# Patient Record
Sex: Male | Born: 1965 | Race: White | Hispanic: No | Marital: Single | Smoking: Never smoker
Health system: Southern US, Community
[De-identification: ages and names within clinical notes are randomized; demographics above are authoritative.]

## PROBLEM LIST (undated history)

## (undated) ENCOUNTER — Emergency Department (HOSPITAL_COMMUNITY): Admission: EM | Disposition: A | Discharge status: Home / Self Care | Payer: 59 | Source: Home / Self Care

## (undated) DIAGNOSIS — N118 Other chronic tubulo-interstitial nephritis: Secondary | ICD-10-CM

## (undated) DIAGNOSIS — R519 Headache, unspecified: Secondary | ICD-10-CM

## (undated) DIAGNOSIS — Z960 Presence of urogenital implants: Secondary | ICD-10-CM

## (undated) DIAGNOSIS — N3090 Cystitis, unspecified without hematuria: Secondary | ICD-10-CM

## (undated) DIAGNOSIS — Z87442 Personal history of urinary calculi: Secondary | ICD-10-CM

## (undated) DIAGNOSIS — F819 Developmental disorder of scholastic skills, unspecified: Secondary | ICD-10-CM

## (undated) DIAGNOSIS — IMO0001 Reserved for inherently not codable concepts without codable children: Secondary | ICD-10-CM

## (undated) DIAGNOSIS — T300 Burn of unspecified body region, unspecified degree: Secondary | ICD-10-CM

## (undated) DIAGNOSIS — Z9621 Cochlear implant status: Secondary | ICD-10-CM

## (undated) DIAGNOSIS — H9190 Unspecified hearing loss, unspecified ear: Secondary | ICD-10-CM

## (undated) DIAGNOSIS — E785 Hyperlipidemia, unspecified: Secondary | ICD-10-CM

## (undated) DIAGNOSIS — Z978 Presence of other specified devices: Secondary | ICD-10-CM

## (undated) HISTORY — PX: HYDROCELE EXCISION: SHX482

## (undated) HISTORY — PX: COCHLEAR IMPLANT: SUR684

## (undated) HISTORY — PX: OTHER SURGICAL HISTORY: SHX169

---

## 2000-09-04 ENCOUNTER — Emergency Department (HOSPITAL_COMMUNITY): Admission: EM | Admit: 2000-09-04 | Discharge: 2000-09-04 | Payer: Self-pay | Admitting: Emergency Medicine

## 2000-09-04 ENCOUNTER — Encounter: Payer: Self-pay | Admitting: Emergency Medicine

## 2001-10-07 ENCOUNTER — Encounter: Payer: Self-pay | Admitting: Internal Medicine

## 2001-10-07 ENCOUNTER — Encounter: Admission: RE | Admit: 2001-10-07 | Discharge: 2001-10-07 | Payer: Self-pay | Admitting: Internal Medicine

## 2001-11-01 ENCOUNTER — Encounter: Payer: Self-pay | Admitting: Emergency Medicine

## 2001-11-02 ENCOUNTER — Inpatient Hospital Stay (HOSPITAL_COMMUNITY): Admission: EM | Admit: 2001-11-02 | Discharge: 2001-11-03 | Payer: Self-pay | Admitting: Emergency Medicine

## 2001-11-11 ENCOUNTER — Encounter: Payer: Self-pay | Admitting: Urology

## 2001-11-11 ENCOUNTER — Ambulatory Visit (HOSPITAL_COMMUNITY): Admission: RE | Admit: 2001-11-11 | Discharge: 2001-11-11 | Payer: Self-pay | Admitting: Urology

## 2002-09-25 ENCOUNTER — Inpatient Hospital Stay (HOSPITAL_COMMUNITY): Admission: EM | Admit: 2002-09-25 | Discharge: 2002-09-27 | Payer: Self-pay | Admitting: Emergency Medicine

## 2002-10-01 ENCOUNTER — Emergency Department (HOSPITAL_COMMUNITY): Admission: EM | Admit: 2002-10-01 | Discharge: 2002-10-01 | Payer: Self-pay | Admitting: Emergency Medicine

## 2002-10-02 ENCOUNTER — Encounter: Payer: Self-pay | Admitting: Emergency Medicine

## 2002-10-06 ENCOUNTER — Encounter: Payer: Self-pay | Admitting: Gastroenterology

## 2002-10-06 ENCOUNTER — Encounter: Admission: RE | Admit: 2002-10-06 | Discharge: 2002-10-06 | Payer: Self-pay | Admitting: Gastroenterology

## 2002-10-26 ENCOUNTER — Encounter: Payer: Self-pay | Admitting: Urology

## 2002-10-26 ENCOUNTER — Ambulatory Visit (HOSPITAL_COMMUNITY): Admission: RE | Admit: 2002-10-26 | Discharge: 2002-10-26 | Payer: Self-pay | Admitting: Urology

## 2002-11-03 ENCOUNTER — Encounter: Admission: RE | Admit: 2002-11-03 | Discharge: 2002-11-03 | Payer: Self-pay | Admitting: Gastroenterology

## 2002-11-03 ENCOUNTER — Encounter: Payer: Self-pay | Admitting: Gastroenterology

## 2002-12-03 ENCOUNTER — Encounter: Payer: Self-pay | Admitting: Internal Medicine

## 2002-12-03 ENCOUNTER — Ambulatory Visit (HOSPITAL_COMMUNITY): Admission: RE | Admit: 2002-12-03 | Discharge: 2002-12-03 | Payer: Self-pay | Admitting: Internal Medicine

## 2004-06-18 ENCOUNTER — Emergency Department (HOSPITAL_COMMUNITY): Admission: EM | Admit: 2004-06-18 | Discharge: 2004-06-18 | Payer: Self-pay | Admitting: Emergency Medicine

## 2004-07-28 ENCOUNTER — Emergency Department (HOSPITAL_COMMUNITY): Admission: EM | Admit: 2004-07-28 | Discharge: 2004-07-28 | Payer: Self-pay | Admitting: Emergency Medicine

## 2004-08-25 ENCOUNTER — Emergency Department (HOSPITAL_COMMUNITY): Admission: EM | Admit: 2004-08-25 | Discharge: 2004-08-26 | Payer: Self-pay | Admitting: Emergency Medicine

## 2005-04-13 ENCOUNTER — Ambulatory Visit (HOSPITAL_COMMUNITY): Admission: RE | Admit: 2005-04-13 | Discharge: 2005-04-13 | Payer: Self-pay | Admitting: Urology

## 2005-05-07 ENCOUNTER — Ambulatory Visit (HOSPITAL_COMMUNITY): Admission: RE | Admit: 2005-05-07 | Discharge: 2005-05-07 | Payer: Self-pay | Admitting: Urology

## 2005-07-10 ENCOUNTER — Emergency Department (HOSPITAL_COMMUNITY): Admission: EM | Admit: 2005-07-10 | Discharge: 2005-07-10 | Payer: Self-pay | Admitting: Emergency Medicine

## 2006-03-09 ENCOUNTER — Emergency Department (HOSPITAL_COMMUNITY): Admission: EM | Admit: 2006-03-09 | Discharge: 2006-03-09 | Payer: Self-pay | Admitting: Family Medicine

## 2006-10-28 IMAGING — CR DG CHEST 2V
2 series · 2 of 2 positions shown · non-contrast
Comparison: none

CLINICAL DATA: Rt axillary pain after twisting injury.
 PA AND LATERAL CHEST: 
 Heart size and vascularity are normal and the lungs are clear.  There is a mild thoracolumbar scoliosis.  No discrete bony abnormality.

[view not recorded (1 of 2)]
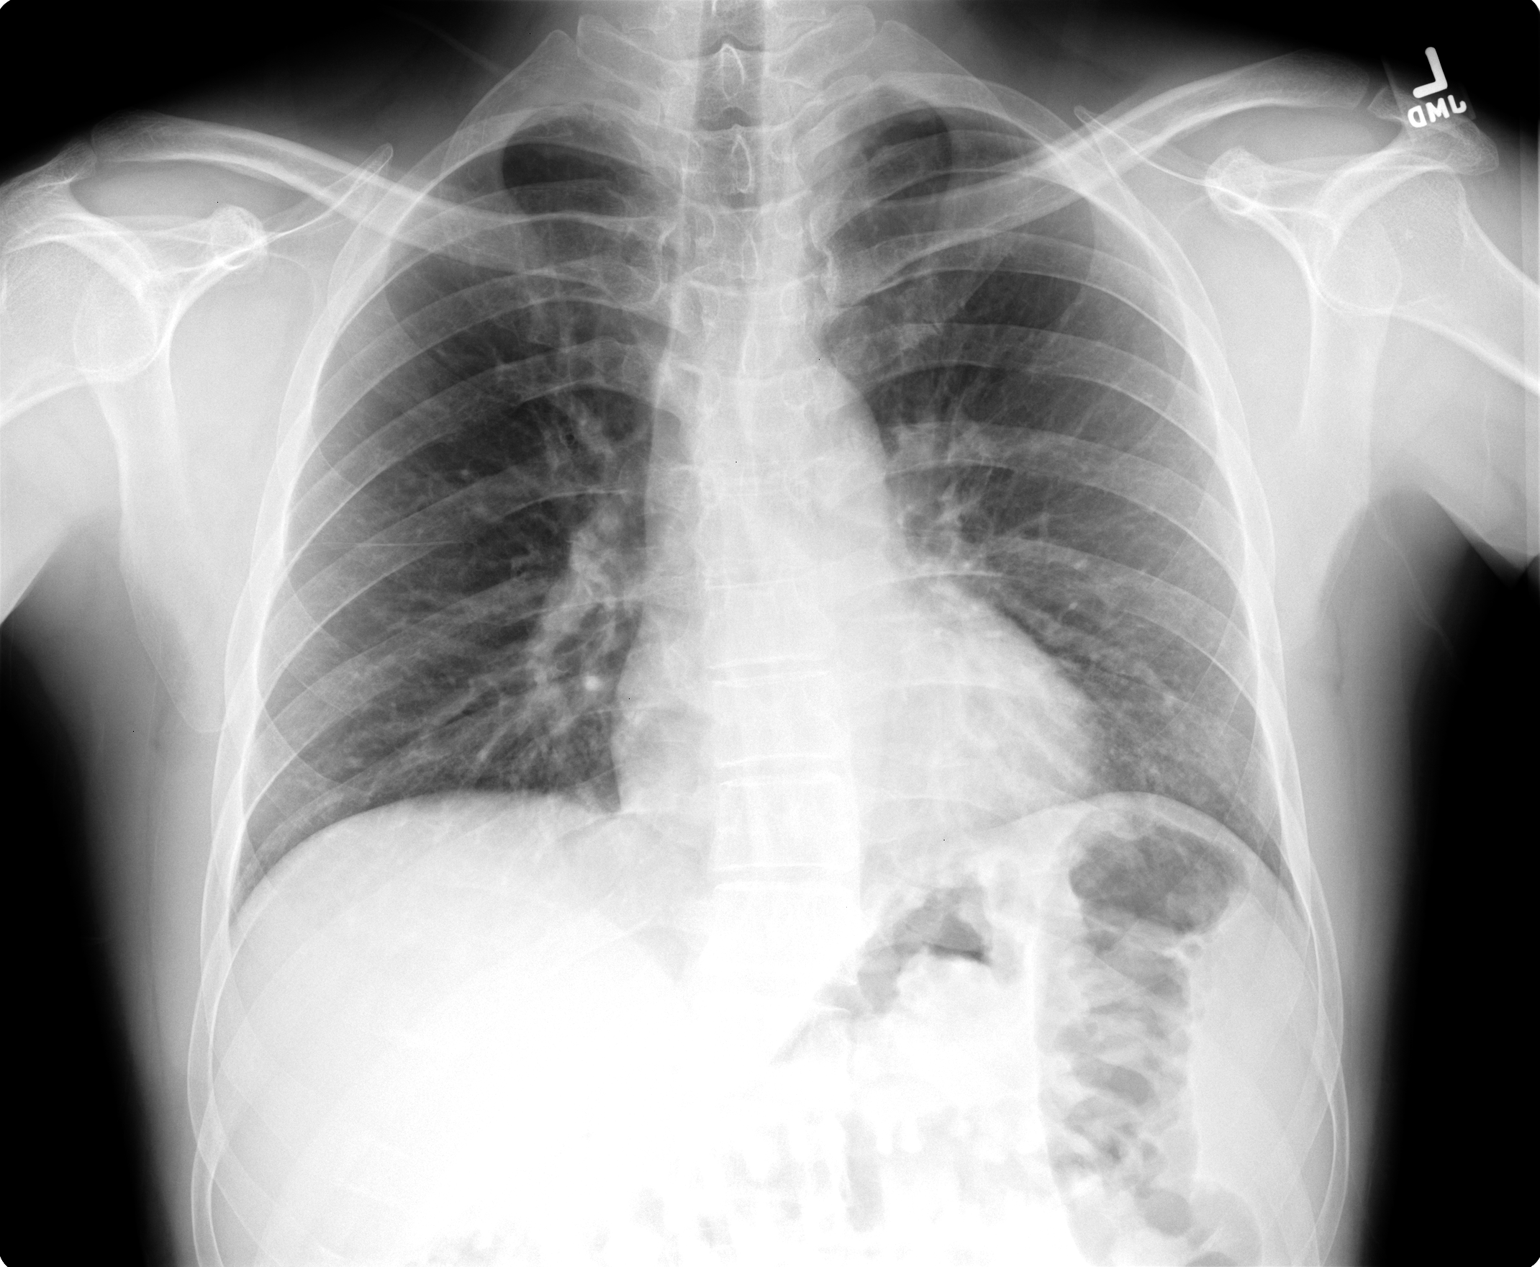

[view not recorded (2 of 2)]
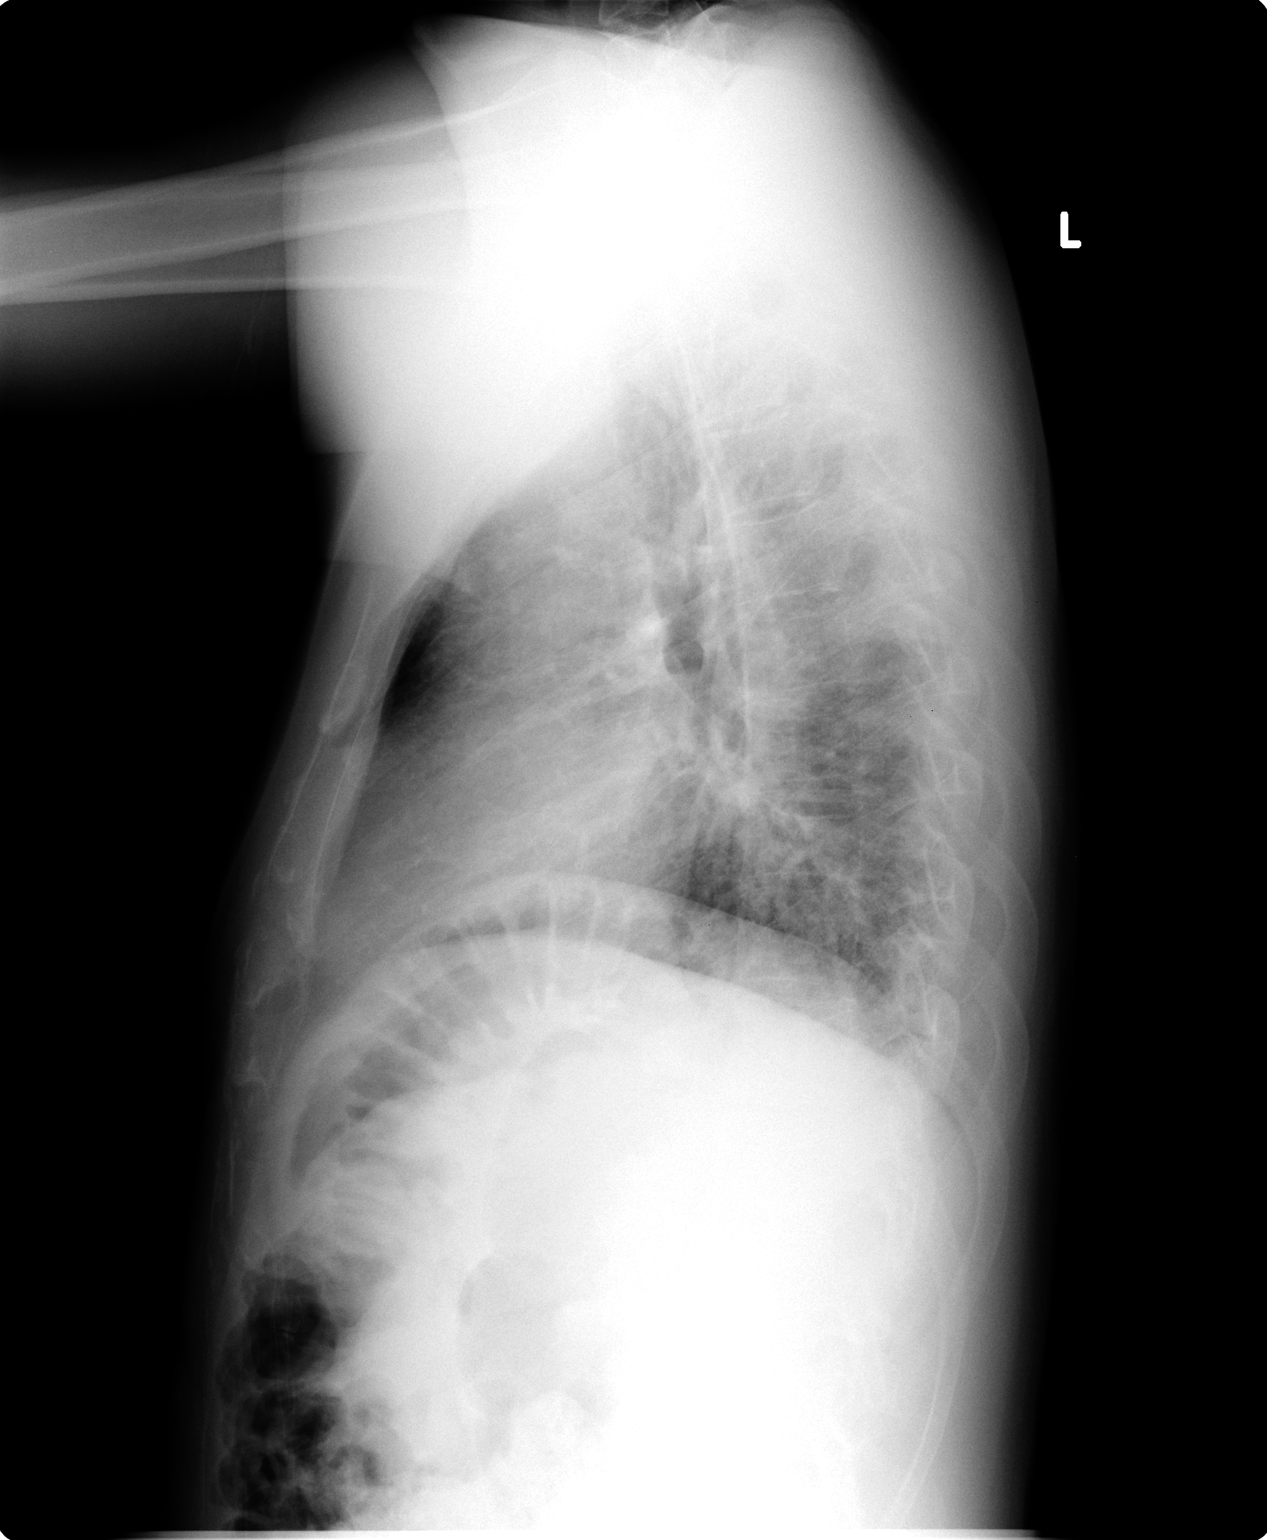

[2 of 2 positions shown; findings below may reference images not displayed]

IMPRESSION: No acute disease.

## 2007-02-16 ENCOUNTER — Emergency Department (HOSPITAL_COMMUNITY): Admission: EM | Admit: 2007-02-16 | Discharge: 2007-02-16 | Payer: Self-pay | Admitting: *Deleted

## 2008-03-04 ENCOUNTER — Emergency Department (HOSPITAL_COMMUNITY): Admission: EM | Admit: 2008-03-04 | Discharge: 2008-03-04 | Payer: Self-pay | Admitting: Family Medicine

## 2008-06-30 ENCOUNTER — Encounter: Admission: RE | Admit: 2008-06-30 | Discharge: 2008-06-30 | Payer: Self-pay | Admitting: Internal Medicine

## 2008-12-17 ENCOUNTER — Emergency Department (HOSPITAL_COMMUNITY): Admission: EM | Admit: 2008-12-17 | Discharge: 2008-12-18 | Payer: Self-pay | Admitting: Emergency Medicine

## 2008-12-18 ENCOUNTER — Encounter (HOSPITAL_COMMUNITY): Admission: RE | Admit: 2008-12-18 | Discharge: 2009-01-12 | Payer: Self-pay | Admitting: Emergency Medicine

## 2008-12-20 ENCOUNTER — Emergency Department (HOSPITAL_COMMUNITY): Admission: EM | Admit: 2008-12-20 | Discharge: 2008-12-20 | Payer: Self-pay | Admitting: Emergency Medicine

## 2009-01-10 ENCOUNTER — Emergency Department (HOSPITAL_COMMUNITY): Admission: EM | Admit: 2009-01-10 | Discharge: 2009-01-10 | Payer: Self-pay | Admitting: Emergency Medicine

## 2009-05-18 ENCOUNTER — Inpatient Hospital Stay (HOSPITAL_COMMUNITY): Admission: EM | Admit: 2009-05-18 | Discharge: 2009-05-22 | Payer: Self-pay | Admitting: Cardiology

## 2009-05-18 ENCOUNTER — Encounter: Payer: Self-pay | Admitting: Emergency Medicine

## 2009-05-18 ENCOUNTER — Ambulatory Visit: Payer: Self-pay | Admitting: Internal Medicine

## 2009-05-19 ENCOUNTER — Encounter: Payer: Self-pay | Admitting: Internal Medicine

## 2009-06-13 ENCOUNTER — Emergency Department (HOSPITAL_COMMUNITY): Admission: EM | Admit: 2009-06-13 | Discharge: 2009-06-13 | Payer: Self-pay | Admitting: Emergency Medicine

## 2009-06-14 ENCOUNTER — Emergency Department (HOSPITAL_COMMUNITY): Admission: EM | Admit: 2009-06-14 | Discharge: 2009-06-14 | Payer: Self-pay | Admitting: Emergency Medicine

## 2009-06-17 ENCOUNTER — Encounter: Admission: RE | Admit: 2009-06-17 | Discharge: 2009-06-17 | Payer: Self-pay | Admitting: Family Medicine

## 2009-06-22 ENCOUNTER — Encounter: Admission: RE | Admit: 2009-06-22 | Discharge: 2009-07-13 | Payer: Self-pay | Admitting: Family Medicine

## 2009-07-06 ENCOUNTER — Emergency Department (HOSPITAL_COMMUNITY): Admission: EM | Admit: 2009-07-06 | Discharge: 2009-07-06 | Payer: Self-pay | Admitting: Family Medicine

## 2009-10-15 ENCOUNTER — Emergency Department (HOSPITAL_COMMUNITY): Admission: EM | Admit: 2009-10-15 | Discharge: 2009-10-15 | Payer: Self-pay | Admitting: Emergency Medicine

## 2009-10-15 ENCOUNTER — Emergency Department (HOSPITAL_COMMUNITY): Admission: EM | Admit: 2009-10-15 | Discharge: 2009-10-15 | Payer: Self-pay | Admitting: Family Medicine

## 2009-10-16 ENCOUNTER — Emergency Department (HOSPITAL_COMMUNITY): Admission: EM | Admit: 2009-10-16 | Discharge: 2009-10-16 | Payer: Self-pay | Admitting: Emergency Medicine

## 2009-10-22 ENCOUNTER — Emergency Department (HOSPITAL_COMMUNITY): Admission: EM | Admit: 2009-10-22 | Discharge: 2009-10-22 | Payer: Self-pay | Admitting: Emergency Medicine

## 2009-10-24 ENCOUNTER — Inpatient Hospital Stay (HOSPITAL_COMMUNITY): Admission: AD | Admit: 2009-10-24 | Discharge: 2009-10-28 | Payer: Self-pay | Admitting: Urology

## 2009-10-25 ENCOUNTER — Ambulatory Visit: Payer: Self-pay | Admitting: Infectious Disease

## 2009-10-31 ENCOUNTER — Encounter: Payer: Self-pay | Admitting: Infectious Disease

## 2009-11-19 ENCOUNTER — Emergency Department (HOSPITAL_COMMUNITY): Admission: EM | Admit: 2009-11-19 | Discharge: 2009-11-19 | Payer: Self-pay | Admitting: Emergency Medicine

## 2010-07-31 ENCOUNTER — Emergency Department (HOSPITAL_COMMUNITY)
Admission: EM | Admit: 2010-07-31 | Discharge: 2010-07-31 | Payer: Self-pay | Source: Home / Self Care | Admitting: Emergency Medicine

## 2010-08-02 LAB — URINALYSIS, ROUTINE W REFLEX MICROSCOPIC
Bilirubin Urine: NEGATIVE
Hgb urine dipstick: NEGATIVE
Ketones, ur: NEGATIVE mg/dL
Nitrite: POSITIVE — AB
Protein, ur: NEGATIVE mg/dL
Specific Gravity, Urine: 1.018 (ref 1.005–1.030)
Urine Glucose, Fasting: NEGATIVE mg/dL
Urobilinogen, UA: 0.2 mg/dL (ref 0.0–1.0)
pH: 5.5 (ref 5.0–8.0)

## 2010-08-02 LAB — CBC
HCT: 48.1 % (ref 39.0–52.0)
Hemoglobin: 17 g/dL (ref 13.0–17.0)
MCH: 31.4 pg (ref 26.0–34.0)
MCHC: 35.3 g/dL (ref 30.0–36.0)
MCV: 88.9 fL (ref 78.0–100.0)
Platelets: 196 10*3/uL (ref 150–400)
RBC: 5.41 MIL/uL (ref 4.22–5.81)
RDW: 12.3 % (ref 11.5–15.5)
WBC: 9.2 10*3/uL (ref 4.0–10.5)

## 2010-08-02 LAB — COMPREHENSIVE METABOLIC PANEL
ALT: 34 U/L (ref 0–53)
AST: 20 U/L (ref 0–37)
Albumin: 4.1 g/dL (ref 3.5–5.2)
Alkaline Phosphatase: 68 U/L (ref 39–117)
BUN: 14 mg/dL (ref 6–23)
CO2: 25 mEq/L (ref 19–32)
Calcium: 9.9 mg/dL (ref 8.4–10.5)
Chloride: 108 mEq/L (ref 96–112)
Creatinine, Ser: 1.03 mg/dL (ref 0.4–1.5)
GFR calc Af Amer: >60 mL/min (ref >60)
GFR calc non Af Amer: >60 mL/min (ref >60)
Glucose, Bld: 114 mg/dL — ABNORMAL HIGH (ref 70–99)
Potassium: 3.7 mEq/L (ref 3.5–5.1)
Sodium: 140 mEq/L (ref 135–145)
Total Bilirubin: 1.3 mg/dL — ABNORMAL HIGH (ref 0.3–1.2)
Total Protein: 7.6 g/dL (ref 6.0–8.3)

## 2010-08-02 LAB — DIFFERENTIAL
Basophils Absolute: 0 10*3/uL (ref 0.0–0.1)
Basophils Relative: 0 % (ref 0–1)
Eosinophils Absolute: 0 10*3/uL (ref 0.0–0.7)
Eosinophils Relative: 0 % (ref 0–5)
Lymphocytes Relative: 14 % (ref 12–46)
Lymphs Abs: 1.3 10*3/uL (ref 0.7–4.0)
Monocytes Absolute: 0.5 10*3/uL (ref 0.1–1.0)
Monocytes Relative: 6 % (ref 3–12)
Neutro Abs: 7.4 10*3/uL (ref 1.7–7.7)
Neutrophils Relative %: 80 % — ABNORMAL HIGH (ref 43–77)

## 2010-08-02 LAB — URINE MICROSCOPIC-ADD ON

## 2010-08-02 LAB — LIPASE, BLOOD: Lipase: 23 U/L (ref 11–59)

## 2010-08-07 LAB — URINE CULTURE
Colony Count: >=100000
Culture  Setup Time: 201201161558

## 2010-08-16 ENCOUNTER — Other Ambulatory Visit: Payer: Self-pay | Admitting: Gastroenterology

## 2010-08-22 ENCOUNTER — Ambulatory Visit
Admission: RE | Admit: 2010-08-22 | Discharge: 2010-08-22 | Disposition: A | Discharge status: Home / Self Care | Payer: 59 | Source: Ambulatory Visit | Attending: Gastroenterology | Admitting: Gastroenterology

## 2010-08-22 MED ORDER — IOHEXOL 300 MG/ML  SOLN: 100.0000 mL | Freq: Once | INTRAMUSCULAR | Status: AC | PRN

## 2010-09-21 ENCOUNTER — Other Ambulatory Visit: Payer: Self-pay | Admitting: Gastroenterology

## 2010-10-03 LAB — URINE CULTURE
Colony Count: NO GROWTH
Culture: NO GROWTH

## 2010-10-03 LAB — URINALYSIS, ROUTINE W REFLEX MICROSCOPIC
Nitrite: NEGATIVE
pH: 5.5 (ref 5.0–8.0)

## 2010-10-03 LAB — URINE MICROSCOPIC-ADD ON

## 2010-10-04 LAB — ANAEROBIC CULTURE

## 2010-10-04 LAB — BASIC METABOLIC PANEL
BUN: 11 mg/dL (ref 6–23)
CO2: 29 mEq/L (ref 19–32)
Calcium: 8.6 mg/dL (ref 8.4–10.5)
Calcium: 9.2 mg/dL (ref 8.4–10.5)
Chloride: 110 mEq/L (ref 96–112)
Creatinine, Ser: 0.93 mg/dL (ref 0.4–1.5)
GFR calc Af Amer: >60 mL/min (ref >60)
GFR calc non Af Amer: >60 mL/min (ref >60)
GFR calc non Af Amer: >60 mL/min (ref >60)
Glucose, Bld: 114 mg/dL — ABNORMAL HIGH (ref 70–99)
Sodium: 140 mEq/L (ref 135–145)

## 2010-10-04 LAB — URINE MICROSCOPIC-ADD ON

## 2010-10-04 LAB — URINE CULTURE: Special Requests: POSITIVE

## 2010-10-04 LAB — CBC
HCT: 39.8 % (ref 39.0–52.0)
Hemoglobin: 13.8 g/dL (ref 13.0–17.0)
Hemoglobin: 14.4 g/dL (ref 13.0–17.0)
MCHC: 34.5 g/dL (ref 30.0–36.0)
MCHC: 34.8 g/dL (ref 30.0–36.0)
MCV: 93.5 fL (ref 78.0–100.0)
Platelets: 185 10*3/uL (ref 150–400)
Platelets: 227 10*3/uL (ref 150–400)
RBC: 4.8 MIL/uL (ref 4.22–5.81)
RDW: 12.4 % (ref 11.5–15.5)
RDW: 12.5 % (ref 11.5–15.5)
WBC: 14.3 10*3/uL — ABNORMAL HIGH (ref 4.0–10.5)

## 2010-10-04 LAB — POCT I-STAT, CHEM 8
BUN: 11 mg/dL (ref 6–23)
Calcium, Ion: 1.16 mmol/L (ref 1.12–1.32)
Chloride: 104 mEq/L (ref 96–112)
Creatinine, Ser: 0.9 mg/dL (ref 0.4–1.5)
Sodium: 139 mEq/L (ref 135–145)

## 2010-10-04 LAB — URINALYSIS, ROUTINE W REFLEX MICROSCOPIC
Bilirubin Urine: NEGATIVE
Glucose, UA: NEGATIVE mg/dL
Nitrite: NEGATIVE
Protein, ur: NEGATIVE mg/dL
Specific Gravity, Urine: 1.013 (ref 1.005–1.030)
Specific Gravity, Urine: 1.013 (ref 1.005–1.030)
Urobilinogen, UA: 0.2 mg/dL (ref 0.0–1.0)
Urobilinogen, UA: 0.2 mg/dL (ref 0.0–1.0)

## 2010-10-04 LAB — TISSUE CULTURE: Gram Stain: NONE SEEN

## 2010-10-04 LAB — CULTURE, ROUTINE-ABSCESS: Culture: NO GROWTH

## 2010-10-04 LAB — DIFFERENTIAL
Lymphocytes Relative: 6 % — ABNORMAL LOW (ref 12–46)
Lymphs Abs: 0.8 10*3/uL (ref 0.7–4.0)
Monocytes Relative: 3 % (ref 3–12)
Neutro Abs: 13 10*3/uL — ABNORMAL HIGH (ref 1.7–7.7)
Neutrophils Relative %: 91 % — ABNORMAL HIGH (ref 43–77)

## 2010-10-18 LAB — BASIC METABOLIC PANEL
BUN: 13 mg/dL (ref 6–23)
BUN: 14 mg/dL (ref 6–23)
CO2: 25 mEq/L (ref 19–32)
CO2: 26 mEq/L (ref 19–32)
Chloride: 109 mEq/L (ref 96–112)
Chloride: 102 mEq/L (ref 96–112)
Creatinine, Ser: 0.88 mg/dL (ref 0.4–1.5)
GFR calc Af Amer: >60 mL/min (ref >60)
Glucose, Bld: 108 mg/dL — ABNORMAL HIGH (ref 70–99)
Potassium: 3.5 mEq/L (ref 3.5–5.1)

## 2010-10-18 LAB — URINALYSIS, ROUTINE W REFLEX MICROSCOPIC
Bilirubin Urine: NEGATIVE
Glucose, UA: NEGATIVE mg/dL
Glucose, UA: NEGATIVE mg/dL
Specific Gravity, Urine: 1.014 (ref 1.005–1.030)
pH: 7.5 (ref 5.0–8.0)
pH: 8.5 — ABNORMAL HIGH (ref 5.0–8.0)

## 2010-10-18 LAB — CBC
HCT: 43.9 % (ref 39.0–52.0)
HCT: 48.5 % (ref 39.0–52.0)
HCT: 46.9 % (ref 39.0–52.0)
HCT: 47.6 % (ref 39.0–52.0)
Hemoglobin: 15.5 g/dL (ref 13.0–17.0)
Hemoglobin: 16.5 g/dL (ref 13.0–17.0)
MCHC: 35.3 g/dL (ref 30.0–36.0)
MCHC: 35.2 g/dL (ref 30.0–36.0)
MCV: 91.7 fL (ref 78.0–100.0)
MCV: 91.7 fL (ref 78.0–100.0)
MCV: 92.5 fL (ref 78.0–100.0)
MCV: 92.5 fL (ref 78.0–100.0)
Platelets: 191 10*3/uL (ref 150–400)
Platelets: 176 10*3/uL (ref 150–400)
Platelets: 168 10*3/uL (ref 150–400)
Platelets: 190 10*3/uL (ref 150–400)
RBC: 4.91 MIL/uL (ref 4.22–5.81)
RBC: 5.14 MIL/uL (ref 4.22–5.81)
RDW: 12.9 % (ref 11.5–15.5)
RDW: 12.3 % (ref 11.5–15.5)
RDW: 12.4 % (ref 11.5–15.5)
RDW: 12.5 % (ref 11.5–15.5)
WBC: 11 10*3/uL — ABNORMAL HIGH (ref 4.0–10.5)
WBC: 8 10*3/uL (ref 4.0–10.5)
WBC: 8.9 10*3/uL (ref 4.0–10.5)

## 2010-10-18 LAB — URINE MICROSCOPIC-ADD ON

## 2010-10-18 LAB — HEPARIN LEVEL (UNFRACTIONATED)
Heparin Unfractionated: 0.33 IU/mL (ref 0.30–0.70)
Heparin Unfractionated: <0.1 IU/mL — ABNORMAL LOW (ref 0.30–0.70)

## 2010-10-18 LAB — DIFFERENTIAL
Basophils Absolute: 0 10*3/uL (ref 0.0–0.1)
Eosinophils Absolute: 0.1 10*3/uL (ref 0.0–0.7)
Eosinophils Relative: 1 % (ref 0–5)
Eosinophils Relative: 0 % (ref 0–5)
Lymphocytes Relative: 13 % (ref 12–46)
Lymphocytes Relative: 8 % — ABNORMAL LOW (ref 12–46)
Monocytes Absolute: 0.8 10*3/uL (ref 0.1–1.0)
Monocytes Absolute: 1 10*3/uL (ref 0.1–1.0)
Monocytes Relative: 9 % (ref 3–12)
Neutro Abs: 9.1 10*3/uL — ABNORMAL HIGH (ref 1.7–7.7)

## 2010-10-18 LAB — COMPREHENSIVE METABOLIC PANEL
AST: 37 U/L (ref 0–37)
Albumin: 4.3 g/dL (ref 3.5–5.2)
BUN: 13 mg/dL (ref 6–23)
BUN: 15 mg/dL (ref 6–23)
CO2: 29 mEq/L (ref 19–32)
Chloride: 105 mEq/L (ref 96–112)
Chloride: 102 mEq/L (ref 96–112)
Creatinine, Ser: 1.06 mg/dL (ref 0.4–1.5)
Creatinine, Ser: 1.09 mg/dL (ref 0.4–1.5)
GFR calc Af Amer: >60 mL/min (ref >60)
GFR calc non Af Amer: >60 mL/min (ref >60)
Potassium: 3.7 mEq/L (ref 3.5–5.1)
Total Bilirubin: 0.9 mg/dL (ref 0.3–1.2)
Total Protein: 7.6 g/dL (ref 6.0–8.3)

## 2010-10-18 LAB — CARDIAC PANEL(CRET KIN+CKTOT+MB+TROPI)
CK, MB: 0.5 ng/mL (ref 0.3–4.0)
Total CK: 44 U/L (ref 7–232)

## 2010-10-18 LAB — LIPID PANEL
Cholesterol: 209 mg/dL — ABNORMAL HIGH (ref 0–200)
LDL Cholesterol: 147 mg/dL — ABNORMAL HIGH (ref 0–99)
Triglycerides: 128 mg/dL (ref <150)

## 2010-10-18 LAB — POCT CARDIAC MARKERS
CKMB, poc: <1 ng/mL — ABNORMAL LOW (ref 1.0–8.0)
Troponin i, poc: <0.05 ng/mL (ref 0.00–0.09)

## 2010-10-18 LAB — CK TOTAL AND CKMB (NOT AT ARMC)
CK, MB: 0.4 ng/mL (ref 0.3–4.0)
Relative Index: INVALID (ref 0.0–2.5)
Total CK: 45 U/L (ref 7–232)

## 2010-10-18 LAB — APTT: aPTT: 29 seconds (ref 24–37)

## 2010-10-18 LAB — URINE CULTURE: Colony Count: 45000

## 2010-10-18 LAB — GLUCOSE, CAPILLARY: Glucose-Capillary: 63 mg/dL — ABNORMAL LOW (ref 70–99)

## 2010-10-23 LAB — URINE MICROSCOPIC-ADD ON

## 2010-10-23 LAB — URINALYSIS, ROUTINE W REFLEX MICROSCOPIC
Bilirubin Urine: NEGATIVE
Glucose, UA: NEGATIVE mg/dL
Glucose, UA: NEGATIVE mg/dL
Ketones, ur: NEGATIVE mg/dL
Nitrite: NEGATIVE
Specific Gravity, Urine: 1.013 (ref 1.005–1.030)
Specific Gravity, Urine: 1.013 (ref 1.005–1.030)
Specific Gravity, Urine: 1.016 (ref 1.005–1.030)
pH: 8.5 — ABNORMAL HIGH (ref 5.0–8.0)
pH: 7.5 (ref 5.0–8.0)
pH: 8 (ref 5.0–8.0)

## 2010-10-23 LAB — CBC
HCT: 49.3 % (ref 39.0–52.0)
Hemoglobin: 17.3 g/dL — ABNORMAL HIGH (ref 13.0–17.0)
MCHC: 35.1 g/dL (ref 30.0–36.0)
MCV: 91.9 fL (ref 78.0–100.0)
RBC: 5.07 MIL/uL (ref 4.22–5.81)
RBC: 5.39 MIL/uL (ref 4.22–5.81)
RDW: 12.4 % (ref 11.5–15.5)
WBC: 7.7 10*3/uL (ref 4.0–10.5)

## 2010-10-23 LAB — DIFFERENTIAL
Basophils Absolute: 0 10*3/uL (ref 0.0–0.1)
Basophils Relative: 0 % (ref 0–1)
Eosinophils Absolute: 0.1 10*3/uL (ref 0.0–0.7)
Eosinophils Relative: 1 % (ref 0–5)
Lymphocytes Relative: 18 % (ref 12–46)
Lymphocytes Relative: 14 % (ref 12–46)
Lymphs Abs: 1.4 10*3/uL (ref 0.7–4.0)
Monocytes Absolute: 0.4 10*3/uL (ref 0.1–1.0)
Monocytes Relative: 7 % (ref 3–12)
Monocytes Relative: 4 % (ref 3–12)
Neutro Abs: 5.7 10*3/uL (ref 1.7–7.7)
Neutrophils Relative %: 73 % (ref 43–77)

## 2010-10-23 LAB — URINE CULTURE
Colony Count: >=100000
Colony Count: >=100000

## 2010-10-23 LAB — BASIC METABOLIC PANEL
CO2: 27 mEq/L (ref 19–32)
Chloride: 109 mEq/L (ref 96–112)
Creatinine, Ser: 1.14 mg/dL (ref 0.4–1.5)
GFR calc Af Amer: >60 mL/min (ref >60)
GFR calc Af Amer: >60 mL/min (ref >60)
Glucose, Bld: 110 mg/dL — ABNORMAL HIGH (ref 70–99)
Potassium: 4.1 mEq/L (ref 3.5–5.1)
Sodium: 143 mEq/L (ref 135–145)
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
## 2012-06-08 ENCOUNTER — Encounter (HOSPITAL_COMMUNITY): Payer: Self-pay | Admitting: *Deleted

## 2012-06-08 ENCOUNTER — Emergency Department (HOSPITAL_COMMUNITY)
Admission: EM | Admit: 2012-06-08 | Discharge: 2012-06-08 | Disposition: A | Discharge status: Home / Self Care | Payer: PRIVATE HEALTH INSURANCE | Attending: Emergency Medicine | Admitting: Emergency Medicine

## 2012-06-08 DIAGNOSIS — N390 Urinary tract infection, site not specified: Secondary | ICD-10-CM

## 2012-06-08 DIAGNOSIS — Z466 Encounter for fitting and adjustment of urinary device: Secondary | ICD-10-CM

## 2012-06-08 DIAGNOSIS — E785 Hyperlipidemia, unspecified: Secondary | ICD-10-CM | POA: Insufficient documentation

## 2012-06-08 LAB — URINE MICROSCOPIC-ADD ON

## 2012-06-08 LAB — URINALYSIS, ROUTINE W REFLEX MICROSCOPIC
Bilirubin Urine: NEGATIVE
Glucose, UA: NEGATIVE mg/dL
Ketones, ur: NEGATIVE mg/dL
Protein, ur: NEGATIVE mg/dL
pH: 7 (ref 5.0–8.0)

## 2012-06-08 MED ORDER — CIPROFLOXACIN HCL 500 MG PO TABS: 500.0000 mg | ORAL_TABLET | Freq: Two times a day (BID) | ORAL | Status: DC

## 2012-06-08 MED ORDER — CIPROFLOXACIN HCL 500 MG PO TABS
ORAL_TABLET | ORAL | Status: AC
  Filled 2012-06-08: qty 1

## 2012-06-08 MED ORDER — CEPHALEXIN 500 MG PO CAPS: 500.0000 mg | ORAL_CAPSULE | Freq: Four times a day (QID) | ORAL | Status: DC

## 2012-06-08 MED ORDER — CIPROFLOXACIN HCL 500 MG PO TABS: 500.0000 mg | ORAL_TABLET | Freq: Once | ORAL | Status: DC

## 2012-06-08 MED ORDER — CEPHALEXIN 500 MG PO CAPS
ORAL_CAPSULE | ORAL | Status: AC
  Administered 2012-06-08: 500 mg
  Filled 2012-06-08: qty 1

## 2012-06-08 NOTE — ED Notes (Signed)
Irrigated existing catheter, beg drained to assess for patentcy of urinary catheter.

## 2012-06-08 NOTE — ED Provider Notes (Signed)
Medical screening examination/treatment/procedure(s) were performed by non-physician practitioner and as supervising physician I was immediately available for consultation/collaboration. Devoria Albe, MD, Armando Gang   Ward Givens, MD 06/08/12 [phone]

## 2012-06-08 NOTE — ED Notes (Signed)
Pt from home with c/o catheter not draining. Catheter replaced 1 hour ago, sts it's burning now.

## 2012-06-08 NOTE — ED Notes (Signed)
Per PA prescription changed due to cipro allergy, noted to PA that pt has often taken rocephin per his mother for UTI's

## 2012-06-08 NOTE — ED Provider Notes (Signed)
History     CSN: [id number]  Arrival date & time 06/08/12  1603   First MD Initiated Contact with Patient 06/08/12 1626      Chief Complaint  Patient presents with  . Urinary Retention    (Consider location/radiation/quality/duration/timing/severity/associated sxs/prior treatment) HPI Comments: 46 year old male presents the emergency department with his mom with burning pain around his urinary catheter after replacing at one hour ago. Patient performs self catheterizations at home since 2001 every week. Prior to changing his catheter is not having any symptoms. Denies abdominal pain, nausea or vomiting. No fever or chills. States he always has some mild redness around where the catheters inserted due to the fact that it "rubs around". Dr. Lissa Hoard follows him for his urinary catheter.  The history is provided by the patient and a parent.    Past Medical History  Diagnosis Date  . Hyperlipemia     Past Surgical History  Procedure Date  . Cochlear implant     No family history on file.  History  Substance Use Topics  . Smoking status: Never Smoker   . Smokeless tobacco: Not on file  . Alcohol Use: No      Review of Systems  Genitourinary:       Positive for burning sensation around catheter.  All other systems reviewed and are negative.    Allergies  Penicillins and Sulfa antibiotics  Home Medications  No current outpatient prescriptions on file.  BP 141/86  Pulse 86  Temp 98.4 F (36.9 C) (Oral)  Resp 18  Ht 5\' 8"  (1.727 m)  Wt 150 lb (68.04 kg)  BMI 22.81 kg/m2  SpO2 100%  Physical Exam  Constitutional: He is oriented to person, place, and time. Vital signs are normal. He appears well-developed and well-nourished. No distress.       Patient hard of hearing.  HENT:  Head: Normocephalic and atraumatic.  Mouth/Throat: Oropharynx is clear and moist.       Hearing aid present in left ear.  Eyes: Conjunctivae normal and EOM are normal. Pupils are  equal, round, and reactive to light.  Neck: Normal range of motion. Neck supple.  Cardiovascular: Normal rate, regular rhythm and normal heart sounds.   Pulmonary/Chest: Effort normal and breath sounds normal.  Abdominal: Soft. Bowel sounds are normal. There is no tenderness. There is no CVA tenderness.  Genitourinary:    Right testis shows no swelling and no tenderness. Left testis shows no swelling and no tenderness. Circumcised.  Musculoskeletal: Normal range of motion. He exhibits no edema.  Neurological: He is alert and oriented to person, place, and time.  Skin: Skin is warm and dry.  Psychiatric: He has a normal mood and affect. His behavior is normal.    ED Course  Procedures (including critical care time)  Labs Reviewed  URINALYSIS, ROUTINE W REFLEX MICROSCOPIC - Abnormal; Notable for the following:    APPearance CLOUDY (*)     Hgb urine dipstick SMALL (*)     Nitrite POSITIVE (*)     Leukocytes, UA LARGE (*)     All other components within normal limits  URINE MICROSCOPIC-ADD ON  URINE CULTURE   No results found.   1. UTI (lower urinary tract infection)   2. Urinary catheter change required       MDM  46 y/o male with burning sensation around urinary catheter. Catheter removed and replaced. Positive for UTI. WBC too numerous to count. Patient states burning sensation is starting to improve with new  catheter but still mildly present. I will treat his UTI. Advised him to f/u with Dr. Lissa Hoard. Patient states understanding of plan and is agreeable. Return precautions discussed. Patient is afebrile with normal vital signs.        Trevor Mace, PA-C 06/08/12 1735

## 2012-06-14 LAB — URINE CULTURE: Colony Count: >=100000

## 2013-01-01 ENCOUNTER — Emergency Department (HOSPITAL_COMMUNITY)
Admission: EM | Admit: 2013-01-01 | Discharge: 2013-01-01 | Disposition: A | Discharge status: Home / Self Care | Payer: PRIVATE HEALTH INSURANCE | Attending: Emergency Medicine | Admitting: Emergency Medicine

## 2013-01-01 ENCOUNTER — Encounter (HOSPITAL_COMMUNITY): Payer: Self-pay | Admitting: Emergency Medicine

## 2013-01-01 DIAGNOSIS — N4889 Other specified disorders of penis: Secondary | ICD-10-CM

## 2013-01-01 DIAGNOSIS — E785 Hyperlipidemia, unspecified: Secondary | ICD-10-CM | POA: Insufficient documentation

## 2013-01-01 DIAGNOSIS — Z87448 Personal history of other diseases of urinary system: Secondary | ICD-10-CM | POA: Insufficient documentation

## 2013-01-01 DIAGNOSIS — N489 Disorder of penis, unspecified: Secondary | ICD-10-CM | POA: Insufficient documentation

## 2013-01-01 DIAGNOSIS — Z880 Allergy status to penicillin: Secondary | ICD-10-CM | POA: Insufficient documentation

## 2013-01-01 DIAGNOSIS — Z79899 Other long term (current) drug therapy: Secondary | ICD-10-CM | POA: Insufficient documentation

## 2013-01-01 HISTORY — DX: Cystitis, unspecified without hematuria: N30.90

## 2013-01-01 LAB — URINALYSIS, ROUTINE W REFLEX MICROSCOPIC
Bilirubin Urine: NEGATIVE
Glucose, UA: NEGATIVE mg/dL
Ketones, ur: NEGATIVE mg/dL
Nitrite: NEGATIVE
Specific Gravity, Urine: 1.009 (ref 1.005–1.030)
pH: 6.5 (ref 5.0–8.0)

## 2013-01-01 LAB — URINE MICROSCOPIC-ADD ON

## 2013-01-01 NOTE — ED Provider Notes (Signed)
History     CSN: [id number]  Arrival date & time 01/01/13  1654   First MD Initiated Contact with Patient 01/01/13 1711      Chief Complaint  Patient presents with  . Dysuria     The history is provided by the patient.   patient has a history of chronic cystitis and he hasn't chronic indwelling Foley catheter.  He reports over the past 5-6 days he's had pain along the shaft of his penis and a sense of "burning when urine comes out.  He also reports that the pain gets worse when waters turned on or he reports coffee.  He's had these events occur before in his urologist please some this may represent bladder spasm.  He changes his catheter weekly and is due for another change.  No fevers or chills.  No nausea or vomiting.  No other complaints.  Symptoms are mild to moderate in severity.  No testicular pain.  No lower abdominal pain.  Catheter seems to be draining normally per the patient  Past Medical History  Diagnosis Date  . Hyperlipemia   . Cystitis     Past Surgical History  Procedure Laterality Date  . Cochlear implant      No family history on file.  History  Substance Use Topics  . Smoking status: Never Smoker   . Smokeless tobacco: Not on file  . Alcohol Use: No      Review of Systems  Genitourinary: Positive for dysuria.  All other systems reviewed and are negative.    Allergies  Ciprofloxacin; Penicillins; and Sulfa antibiotics  Home Medications   Current Outpatient Rx  Name  Route  Sig  Dispense  Refill  . simvastatin (ZOCOR) 20 MG tablet   Oral   Take 20 mg by mouth every evening.           BP 129/75  Pulse 79  Temp(Src) 98.5 F (36.9 C) (Oral)  Resp 20  Wt 150 lb (68.04 kg)  BMI 22.81 kg/m2  SpO2 98%  Physical Exam  Nursing note and vitals reviewed. Constitutional: He is oriented to person, place, and time. He appears well-developed and well-nourished.  HENT:  Head: Normocephalic and atraumatic.  Eyes: EOM are normal.  Neck:  Normal range of motion.  Cardiovascular: Normal rate, regular rhythm, normal heart sounds and intact distal pulses.   Pulmonary/Chest: Effort normal and breath sounds normal. No respiratory distress.  Abdominal: Soft. He exhibits no distension. There is no tenderness.  Genitourinary: Rectum normal.  Circumcised penis.  Foley catheter in place.  No erythema or drainage around his urethral meatus.  Mild tenderness along the shaft of his penis without significant swelling or erythema.  Normal testicular lie.  No testicular tenderness.  No scrotal changes.  Musculoskeletal: Normal range of motion.  Neurological: He is alert and oriented to person, place, and time.  Skin: Skin is warm and dry.  Psychiatric: He has a normal mood and affect. Judgment normal.    ED Course  Procedures (including critical care time)  Labs Reviewed  URINALYSIS, ROUTINE W REFLEX MICROSCOPIC - Abnormal; Notable for the following:    Hgb urine dipstick MODERATE (*)    Leukocytes, UA MODERATE (*)    All other components within normal limits  URINE MICROSCOPIC-ADD ON - Abnormal; Notable for the following:    Bacteria, UA FEW (*)    All other components within normal limits  URINE CULTURE   No results found.   1. Penile pain  MDM  Your culture sent.  No external signs of penile infection or scrotal infection.  Close urology followup.  Instructed to return the emergency department for new or worsening symptoms        Lyanne Co, MD 01/01/13 1943

## 2013-01-01 NOTE — Progress Notes (Signed)
   CARE MANAGEMENT ED NOTE 01/01/2013  Patient:  Philip Stark, Philip Stark   Account Number:  [id number]  Date Initiated:  01/01/2013  Documentation initiated by:  Radford Pax  Subjective/Objective Assessment:   Patient presents with pain on urination.     Subjective/Objective Assessment Detail:     Action/Plan:   Action/Plan Detail:   Anticipated DC Date:       Status Recommendation to Physician:   Result of Recommendation:    Other ED Services  Consult Working Plan    DC Planning Services  Other  PCP issues    Choice offered to / List presented to:            Status of service:  Completed, signed off  ED Comments:   ED Comments Detail:  Patient listed as not having a pcp.  EDCM spoke to patient and his wife who stated Dr. Maurice Small is his pcp and Dr. Lorin Picket Mcdermitt is his urologist.  No further needs at this time.

## 2013-01-01 NOTE — ED Notes (Signed)
Patient with foley catheter and leg bag with chronic cystitis comes in today with pain with urination.  Denies fever, nausea, vomiting or abdominal pain.  Patient changes foley weekly and it was last changed on Monday.

## 2013-01-06 LAB — URINE CULTURE: Colony Count: 80000

## 2013-01-07 NOTE — ED Notes (Signed)
Post ED Visit - Positive Culture Follow-up  Culture report reviewed by antimicrobial stewardship pharmacist: []  Wes Dulaney, Pharm.D., BCPS [x]  Celedonio Miyamoto, Pharm.D., BCPS []  Georgina Pillion, Pharm.D., BCPS []  [place], [address].D., BCPS, AAHIVP []  Estella Husk, Pharm.D., BCPS, AAHIVP  Positive urine culture No abx prescribed: fax copy of urine culture to Dr Lorin Picket McDermitt    Harrie Jeans, Delia Chimes 01/07/2013, 12:42 PM

## 2013-05-21 ENCOUNTER — Other Ambulatory Visit: Payer: Self-pay

## 2014-03-29 ENCOUNTER — Emergency Department (HOSPITAL_COMMUNITY)
Admission: EM | Admit: 2014-03-29 | Discharge: 2014-03-29 | Disposition: A | Discharge status: Home / Self Care | Payer: Worker's Compensation | Attending: Emergency Medicine | Admitting: Emergency Medicine

## 2014-03-29 ENCOUNTER — Emergency Department (HOSPITAL_COMMUNITY): Payer: Worker's Compensation

## 2014-03-29 ENCOUNTER — Encounter (HOSPITAL_COMMUNITY): Payer: Self-pay | Admitting: Emergency Medicine

## 2014-03-29 DIAGNOSIS — S139XXA Sprain of joints and ligaments of unspecified parts of neck, initial encounter: Secondary | ICD-10-CM | POA: Diagnosis not present

## 2014-03-29 DIAGNOSIS — Z880 Allergy status to penicillin: Secondary | ICD-10-CM | POA: Diagnosis not present

## 2014-03-29 DIAGNOSIS — S161XXA Strain of muscle, fascia and tendon at neck level, initial encounter: Secondary | ICD-10-CM

## 2014-03-29 DIAGNOSIS — W19XXXA Unspecified fall, initial encounter: Secondary | ICD-10-CM

## 2014-03-29 DIAGNOSIS — Z87448 Personal history of other diseases of urinary system: Secondary | ICD-10-CM | POA: Insufficient documentation

## 2014-03-29 DIAGNOSIS — S8990XA Unspecified injury of unspecified lower leg, initial encounter: Secondary | ICD-10-CM | POA: Insufficient documentation

## 2014-03-29 DIAGNOSIS — Y9389 Activity, other specified: Secondary | ICD-10-CM | POA: Diagnosis not present

## 2014-03-29 DIAGNOSIS — Y9289 Other specified places as the place of occurrence of the external cause: Secondary | ICD-10-CM | POA: Diagnosis not present

## 2014-03-29 DIAGNOSIS — S8001XA Contusion of right knee, initial encounter: Secondary | ICD-10-CM

## 2014-03-29 DIAGNOSIS — W010XXA Fall on same level from slipping, tripping and stumbling without subsequent striking against object, initial encounter: Secondary | ICD-10-CM | POA: Insufficient documentation

## 2014-03-29 DIAGNOSIS — Z8639 Personal history of other endocrine, nutritional and metabolic disease: Secondary | ICD-10-CM | POA: Insufficient documentation

## 2014-03-29 DIAGNOSIS — S8000XA Contusion of unspecified knee, initial encounter: Secondary | ICD-10-CM | POA: Insufficient documentation

## 2014-03-29 DIAGNOSIS — S5002XA Contusion of left elbow, initial encounter: Secondary | ICD-10-CM

## 2014-03-29 DIAGNOSIS — Z862 Personal history of diseases of the blood and blood-forming organs and certain disorders involving the immune mechanism: Secondary | ICD-10-CM | POA: Insufficient documentation

## 2014-03-29 DIAGNOSIS — S5000XA Contusion of unspecified elbow, initial encounter: Secondary | ICD-10-CM | POA: Insufficient documentation

## 2014-03-29 DIAGNOSIS — S99919A Unspecified injury of unspecified ankle, initial encounter: Secondary | ICD-10-CM

## 2014-03-29 DIAGNOSIS — Z91040 Latex allergy status: Secondary | ICD-10-CM | POA: Insufficient documentation

## 2014-03-29 DIAGNOSIS — S99929A Unspecified injury of unspecified foot, initial encounter: Secondary | ICD-10-CM

## 2014-03-29 MED ORDER — OXYCODONE-ACETAMINOPHEN 5-325 MG PO TABS
1.0000 | ORAL_TABLET | Freq: Once | ORAL | Status: AC
  Administered 2014-03-29: 1 via ORAL
  Filled 2014-03-29: qty 1

## 2014-03-29 MED ORDER — IBUPROFEN 800 MG PO TABS: 800.0000 mg | ORAL_TABLET | Freq: Three times a day (TID) | ORAL | Status: DC | PRN

## 2014-03-29 MED ORDER — HYDROCODONE-ACETAMINOPHEN 5-325 MG PO TABS: 1.0000 | ORAL_TABLET | Freq: Four times a day (QID) | ORAL | Status: DC | PRN

## 2014-03-29 NOTE — Discharge Instructions (Signed)
Followup with your primary care Dr. Gustavus Bryant and elevate the areas that are sore.
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
## 2014-03-29 NOTE — ED Notes (Signed)
Dr. Zavitz at the bedside.  

## 2014-03-29 NOTE — ED Notes (Signed)
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

## 2014-03-29 NOTE — ED Notes (Signed)
Pt taken to xray 

## 2014-03-29 NOTE — ED Notes (Signed)
Mom calling PCP office to ask about tetanus

## 2014-03-29 NOTE — ED Notes (Signed)
Pt removed from LSB x 3 assist, c collar maintained, pt tolerated well, no cervical, lumbar & thoracic tenderness reported upon palpation

## 2014-03-29 NOTE — ED Provider Notes (Signed)
Medical screening examination/treatment/procedure(s) were conducted as a shared visit with non-physician practitioner(s) or resident  and myself.  I personally evaluated the patient during the encounter and agree with the findings.   I have personally reviewed any xrays and/ or EKG's with the provider and I agree with interpretation.   Patient presents after mechanical fall and complaints of right knee and neck pain. On exam patient has mild paraspinal and lower midline cervical tenderness, neck supple, C. collar in place, mild anterior knee tenderness without significant swelling, full range of motion with mild discomfort with flexion, neurovascular intact distal leg. No other significant midline vertebral tenderness, abdomen soft nontender, patient well-appearing. X-rays reviewed no acute fractures. Followup outpatient supportive care.  Fall, right knee contusion, neck pain  Enid Skeens, MD 03/29/14 1550

## 2016-01-12 ENCOUNTER — Encounter (HOSPITAL_COMMUNITY): Payer: Self-pay | Admitting: Emergency Medicine

## 2016-01-12 ENCOUNTER — Ambulatory Visit (HOSPITAL_COMMUNITY)
Admission: EM | Admit: 2016-01-12 | Discharge: 2016-01-12 | Disposition: A | Discharge status: Home / Self Care | Payer: Medicare Other | Attending: Emergency Medicine | Admitting: Emergency Medicine

## 2016-01-12 DIAGNOSIS — Z23 Encounter for immunization: Secondary | ICD-10-CM

## 2016-01-12 DIAGNOSIS — S61219A Laceration without foreign body of unspecified finger without damage to nail, initial encounter: Secondary | ICD-10-CM | POA: Diagnosis not present

## 2016-01-12 MED ORDER — TETANUS-DIPHTH-ACELL PERTUSSIS 5-2.5-18.5 LF-MCG/0.5 IM SUSP
0.5000 mL | Freq: Once | INTRAMUSCULAR | Status: AC
  Administered 2016-01-12: 0.5 mL via INTRAMUSCULAR

## 2016-01-12 MED ORDER — TETANUS-DIPHTH-ACELL PERTUSSIS 5-2.5-18.5 LF-MCG/0.5 IM SUSP
INTRAMUSCULAR | Status: AC
  Filled 2016-01-12: qty 0.5

## 2016-01-12 NOTE — ED Provider Notes (Signed)
CSN: [id number]     Arrival date & time 01/12/16  1727 History   First MD Initiated Contact with Patient 01/12/16 1820     Chief Complaint  Patient presents with  . Extremity Laceration   (Consider location/radiation/quality/duration/timing/severity/associated sxs/prior Treatment) HPI History obtained from patient: Location: Right index finger Context/Duration: Cut on metal while at work Severity: 1  Quality:ache Timing:           constant Home Treatment: pressure to control bleeding Associated symptoms:  nnone Family History: HTN    Past Medical History  Diagnosis Date  . Hyperlipemia   . Cystitis    Past Surgical History  Procedure Laterality Date  . Cochlear implant     History reviewed. No pertinent family history. Social History  Substance Use Topics  . Smoking status: Never Smoker   . Smokeless tobacco: None  . Alcohol Use: No    Review of Systems  Denies: HEADACHE, NAUSEA, ABDOMINAL PAIN, CHEST PAIN, CONGESTION, DYSURIA, SHORTNESS OF BREATH  Allergies  Ciprofloxacin; Penicillins; Silicone; Sulfa antibiotics; Vancomycin; and Latex  Home Medications   Prior to Admission medications   Medication Sig Start Date End Date Taking? Authorizing Provider  HYDROcodone-acetaminophen (NORCO/VICODIN) 5-325 MG per tablet Take 1 tablet by mouth every 6 (six) hours as needed for moderate pain. 03/29/14   Charlestine Nighthristopher Lawyer, PA-C  ibuprofen (ADVIL,MOTRIN) 800 MG tablet Take 1 tablet (800 mg total) by mouth every 8 (eight) hours as needed. 03/29/14   Charlestine Nighthristopher Lawyer, PA-C   Meds Ordered and Administered this Visit   Medications  Tdap (BOOSTRIX) injection 0.5 mL (0.5 mLs Intramuscular Given 01/12/16 1840)    There were no vitals taken for this visit. No data found.   Physical Exam NURSES NOTES AND VITAL SIGNS REVIEWED. CONSTITUTIONAL: Well developed, well nourished, no acute distress HEENT: normocephalic, atraumatic EYES: Conjunctiva normal NECK:normal ROM, supple,  no adenopathy PULMONARY:No respiratory distress, normal effort ABDOMINAL: Soft, ND, NT BS+, No CVAT MUSCULOSKELETAL: Normal ROM of all extremities, right index finger, 1 cm superficial wound without active bleeding just above the dip dorsal surface SKIN: warm and dry without rash PSYCHIATRIC: Mood and affect, behavior are normal  ED Course  .Marland Kitchen.Laceration Repair Date/Time: 01/12/2016 7:19 PM Performed by: Tharon AquasPATRICK, FRANK C Authorized by: Charm RingsHONIG, ERIN J Consent: Verbal consent obtained. Risks and benefits: risks, benefits and alternatives were discussed Consent given by: patient Patient identity confirmed: verbally with patient Body area: upper extremity Location details: right index finger Laceration length: 1 cm Foreign bodies: no foreign bodies Irrigation solution: saline Amount of cleaning: standard Skin closure: Steri-Strips and glue Technique: simple Approximation: close Approximation difficulty: simple Patient tolerance: Patient tolerated the procedure well with no immediate complications   (including critical care time)  Labs Review Labs Reviewed - No data to display  Imaging Review No results found.   Visual Acuity Review  Right Eye Distance:   Left Eye Distance:   Bilateral Distance:    Right Eye Near:   Left Eye Near:    Bilateral Near:      Tetanus booster given Expect full recovery   MDM   1. Finger laceration, initial encounter     Patient is reassured that there are no issues that require transfer to higher level of care at this time or additional tests. Patient is advised to continue home symptomatic treatment. Patient is advised that if there are new or worsening symptoms to attend the emergency department, contact primary care provider, or return to UC. Instructions of care provided discharged  home in stable condition.    THIS NOTE WAS GENERATED USING A VOICE RECOGNITION SOFTWARE PROGRAM. ALL REASONABLE EFFORTS  WERE MADE TO PROOFREAD THIS  DOCUMENT FOR ACCURACY.  I have verbally reviewed the discharge instructions with the patient. A printed AVS was given to the patient.  All questions were answered prior to discharge.      Tharon AquasFrank C Patrick, PA 01/12/16 1919  Tharon AquasFrank C Patrick, GeorgiaPA 01/12/16 1921

## 2016-01-12 NOTE — ED Notes (Signed)
Patient's finger placed in a betadine/NaCl2 soak.

## 2016-01-12 NOTE — ED Notes (Signed)
[facility] a [facility] laceration to the pointer finger on his left hand secondary to cutting it on a piece of metal today. The patient stated that his TDAP was NOT up to date.

## 2016-01-12 NOTE — Discharge Instructions (Signed)
Sterile Tape Wound Care °Some cuts and wounds can be closed using sterile tape, also called skin adhesive strips. Skin adhesive strips can be used for shallow (superficial) and simple cuts, wounds, lacerations, and surgical incisions. These strips act in place of stitches to hold the edges of the wound together, allowing for faster healing. Unlike stitches, the adhesive strips do not require needles or anesthetic medicine for placement. The strips will wear off naturally as the wound is healing. It is important to take proper care of your wound at home while it heals.  °HOME CARE INSTRUCTIONS °· Try to keep the area around your wound clean and dry. Do not allow the adhesive strips to get wet for the first 12 hours.   °· Do not use any soaps or ointments on the wound for the first 12 hours.   °· If a bandage (dressing) has been applied, follow your health care provider's instructions for how often to change the dressing. Keep the dressing dry if one has been applied.   °· Do not remove the adhesive strips. They will fall off on their own. If they do not, you may remove them gently after 10 days. You should gently wet the strips before removing them. For example, this can be done in the shower. °· Do not scratch, pick, or rub the wound area.   °· Protect the wound from further injury until it is healed.   °· Protect the wound from sun and tanning bed exposure while it is healing and for several weeks after healing.   °· Only take over-the-counter or prescription medicines as directed by your health care provider.   °· Keep all follow-up appointments as directed by your health care provider.   °SEEK MEDICAL CARE IF: °Your adhesive strips become wet or soaked with blood before the wound has healed. The tape will need to be replaced.  °SEEK IMMEDIATE MEDICAL CARE IF: °· You have increasing pain in the wound.   °· You develop a rash after the strips are applied. °· Your wound becomes red, swollen, hot, or tender.   °· You  have a red streak that goes away from the wound.   °· You have pus coming from the wound.   °· You have increased bleeding from the wound. °· You notice a bad smell coming from the wound.   °· Your wound breaks open. °MAKE SURE YOU: °· Understand these instructions. °· Will watch your condition. °· Will get help right away if you are not doing well or get worse. °  °This information is not intended to replace advice given to you by your health care provider. Make sure you discuss any questions you have with your health care provider. °  °Document Released: 08/09/2004 Document Revised: 07/23/2014 Document Reviewed: 01/21/2013 °Elsevier Interactive Patient Education ©2016 Elsevier Inc. ° °

## 2016-01-13 ENCOUNTER — Encounter (HOSPITAL_COMMUNITY): Payer: Self-pay

## 2016-01-13 ENCOUNTER — Ambulatory Visit (HOSPITAL_COMMUNITY)
Admission: EM | Admit: 2016-01-13 | Discharge: 2016-01-13 | Disposition: A | Discharge status: Home / Self Care | Payer: Medicare Other | Attending: Emergency Medicine | Admitting: Emergency Medicine

## 2016-01-13 DIAGNOSIS — Z09 Encounter for follow-up examination after completed treatment for conditions other than malignant neoplasm: Secondary | ICD-10-CM

## 2016-01-13 DIAGNOSIS — S61219A Laceration without foreign body of unspecified finger without damage to nail, initial encounter: Secondary | ICD-10-CM | POA: Diagnosis not present

## 2016-01-13 NOTE — ED Notes (Signed)
Patient was seen on last night 01/12/2016 for a laceration on left index finger and he cut has reopened

## 2016-01-13 NOTE — ED Provider Notes (Signed)
CSN: [id number]     Arrival date & time 01/13/16  1256 History   None    No chief complaint on file.  (Consider location/radiation/quality/duration/timing/severity/associated sxs/prior Treatment) HPI History obtained from patient: Location:  Right index Context/Duration: Injury yesterday, wound adhesive and steristrip closure, pt is back today for follow up recheck steristrips pulled off and wound bled, mother wants him to have sutures.  Severity: 0  Quality: Timing:          constant  Home Treatment: symptomatic, as directed Associated symptoms:  none Family History:    Past Medical History  Diagnosis Date  . Hyperlipemia   . Cystitis    Past Surgical History  Procedure Laterality Date  . Cochlear implant     No family history on file. Social History  Substance Use Topics  . Smoking status: Never Smoker   . Smokeless tobacco: Not on file  . Alcohol Use: No    Review of Systems  Denies: HEADACHE, NAUSEA, ABDOMINAL PAIN, CHEST PAIN, CONGESTION, DYSURIA, SHORTNESS OF BREATH  Allergies  Ciprofloxacin; Penicillins; Silicone; Sulfa antibiotics; Vancomycin; and Latex  Home Medications   Prior to Admission medications   Medication Sig Start Date End Date Taking? Authorizing Provider  HYDROcodone-acetaminophen (NORCO/VICODIN) 5-325 MG per tablet Take 1 tablet by mouth every 6 (six) hours as needed for moderate pain. 03/29/14   Charlestine Nighthristopher Lawyer, PA-C  ibuprofen (ADVIL,MOTRIN) 800 MG tablet Take 1 tablet (800 mg total) by mouth every 8 (eight) hours as needed. 03/29/14   Charlestine Nighthristopher Lawyer, PA-C   Meds Ordered and Administered this Visit  Medications - No data to display  BP 114/86 mmHg  Pulse 87  Temp(Src) 98.5 F (36.9 C) (Oral)  Resp 12  SpO2 98% No data found.   Physical Exam NURSES NOTES AND VITAL SIGNS REVIEWED. CONSTITUTIONAL: Well developed, well nourished, no acute distress HEENT: normocephalic, atraumatic EYES: Conjunctiva normal NECK:normal ROM,  supple, no adenopathy PULMONARY:No respiratory distress, normal effort ABDOMINAL: Soft, ND, NT BS+, No CVAT MUSCULOSKELETAL: Normal ROM of all extremities, right index fingerno active bleeding, no change from yesterdays exam.  SKIN: warm and dry without rash PSYCHIATRIC: Mood and affect, behavior are normal  ED Course  Procedures (including critical care time)  Labs Review Labs Reviewed - No data to display  Imaging Review No results found.   Visual Acuity Review  Right Eye Distance:   Left Eye Distance:   Bilateral Distance:    Right Eye Near:   Left Eye Near:    Bilateral Near:       Reasonable discussion with mother that her son did not require sutures. That the wound is superficial. The wound will be closed again with steri-strips and a splint placed on the finger.   MDM   1. Follow-up exam after treatment     Patient is reassured that there are no issues that require transfer to higher level of care at this time or additional tests. Patient is advised to continue home symptomatic treatment. Patient is advised that if there are new or worsening symptoms to attend the emergency department, contact primary care provider, or return to UC. Instructions of care provided discharged home in stable condition.    THIS NOTE WAS GENERATED USING A VOICE RECOGNITION SOFTWARE PROGRAM. ALL REASONABLE EFFORTS  WERE MADE TO PROOFREAD THIS DOCUMENT FOR ACCURACY.  I have verbally reviewed the discharge instructions with the patient. A printed AVS was given to the patient.  All questions were answered prior to discharge.  Tharon AquasFrank C Patrick, PA 01/13/16 1341

## 2016-01-13 NOTE — Discharge Instructions (Signed)
Wound Care Taking care of your wound properly can help to prevent pain and infection. It can also help your wound to heal more quickly.  HOW TO CARE FOR YOUR WOUND  Take or apply over-the-counter and prescription medicines only as told by your health care provider.  If you were prescribed antibiotic medicine, take or apply it as told by your health care provider. Do not stop using the antibiotic even if your condition improves.  Clean the wound each day or as told by your health care provider.  Wash the wound with mild soap and water.  Rinse the wound with water to remove all soap.  Pat the wound dry with a clean towel. Do not rub it.  There are many different ways to close and cover a wound. For example, a wound can be covered with stitches (sutures), skin glue, or adhesive strips. Follow instructions from your health care provider about:  How to take care of your wound.  When and how you should change your bandage (dressing).  When you should remove your dressing.  Removing whatever was used to close your wound.  Check your wound every day for signs of infection. Watch for:  Redness, swelling, or pain.  Fluid, blood, or pus.  Keep the dressing dry until your health care provider says it can be removed. Do not take baths, swim, use a hot tub, or do anything that would put your wound underwater until your health care provider approves.  Raise (elevate) the injured area above the level of your heart while you are sitting or lying down.  Do not scratch or pick at the wound.  Keep all follow-up visits as told by your health care provider. This is important. SEEK MEDICAL CARE IF:  You received a tetanus shot and you have swelling, severe pain, redness, or bleeding at the injection site.  You have a fever.  Your pain is not controlled with medicine.  You have increased redness, swelling, or pain at the site of your wound.  You have fluid, blood, or pus coming from your  wound.  You notice a bad smell coming from your wound or your dressing. SEEK IMMEDIATE MEDICAL CARE IF:  You have a red streak going away from your wound.   This information is not intended to replace advice given to you by your health care provider. Make sure you discuss any questions you have with your health care provider.   Document Released: 04/10/2008 Document Revised: 11/16/2014 Document Reviewed: 06/28/2014 Elsevier Interactive Patient Education 2016 Elsevier Inc. Sterile Tape Wound Care Some cuts and wounds can be closed using sterile tape, also called skin adhesive strips. Skin adhesive strips can be used for shallow (superficial) and simple cuts, wounds, lacerations, and surgical incisions. These strips act in place of stitches to hold the edges of the wound together, allowing for faster healing. Unlike stitches, the adhesive strips do not require needles or anesthetic medicine for placement. The strips will wear off naturally as the wound is healing. It is important to take proper care of your wound at home while it heals.  HOME CARE INSTRUCTIONS  Try to keep the area around your wound clean and dry. Do not allow the adhesive strips to get wet for the first 12 hours.   Do not use any soaps or ointments on the wound for the first 12 hours.   If a bandage (dressing) has been applied, follow your health care provider's instructions for how often to change the dressing.  Keep the dressing dry if one has been applied.   Do not remove the adhesive strips. They will fall off on their own. If they do not, you may remove them gently after 10 days. You should gently wet the strips before removing them. For example, this can be done in the shower.  Do not scratch, pick, or rub the wound area.   Protect the wound from further injury until it is healed.   Protect the wound from sun and tanning bed exposure while it is healing and for several weeks after healing.   Only take  over-the-counter or prescription medicines as directed by your health care provider.   Keep all follow-up appointments as directed by your health care provider.  SEEK MEDICAL CARE IF: Your adhesive strips become wet or soaked with blood before the wound has healed. The tape will need to be replaced.  SEEK IMMEDIATE MEDICAL CARE IF:  You have increasing pain in the wound.   You develop a rash after the strips are applied.  Your wound becomes red, swollen, hot, or tender.   You have a red streak that goes away from the wound.   You have pus coming from the wound.   You have increased bleeding from the wound.  You notice a bad smell coming from the wound.   Your wound breaks open. MAKE SURE YOU:  Understand these instructions.  Will watch your condition.  Will get help right away if you are not doing well or get worse.   This information is not intended to replace advice given to you by your health care provider. Make sure you discuss any questions you have with your health care provider.   Document Released: 08/09/2004 Document Revised: 07/23/2014 Document Reviewed: 01/21/2013 Elsevier Interactive Patient Education [employer]2016 Elsevier Inc.

## 2016-01-28 ENCOUNTER — Emergency Department (HOSPITAL_COMMUNITY): Payer: Medicare Other

## 2016-01-28 ENCOUNTER — Encounter (HOSPITAL_COMMUNITY): Payer: Self-pay | Admitting: Emergency Medicine

## 2016-01-28 ENCOUNTER — Emergency Department (HOSPITAL_COMMUNITY)
Admission: EM | Admit: 2016-01-28 | Discharge: 2016-01-28 | Disposition: A | Discharge status: Home / Self Care | Payer: Medicare Other | Attending: Emergency Medicine | Admitting: Emergency Medicine

## 2016-01-28 DIAGNOSIS — Y929 Unspecified place or not applicable: Secondary | ICD-10-CM | POA: Insufficient documentation

## 2016-01-28 DIAGNOSIS — Y9389 Activity, other specified: Secondary | ICD-10-CM | POA: Insufficient documentation

## 2016-01-28 DIAGNOSIS — E785 Hyperlipidemia, unspecified: Secondary | ICD-10-CM | POA: Insufficient documentation

## 2016-01-28 DIAGNOSIS — S61511A Laceration without foreign body of right wrist, initial encounter: Secondary | ICD-10-CM | POA: Diagnosis present

## 2016-01-28 DIAGNOSIS — W268XXA Contact with other sharp object(s), not elsewhere classified, initial encounter: Secondary | ICD-10-CM | POA: Diagnosis not present

## 2016-01-28 DIAGNOSIS — Y999 Unspecified external cause status: Secondary | ICD-10-CM | POA: Diagnosis not present

## 2016-01-28 MED ORDER — LIDOCAINE HCL 1 % IJ SOLN
5.0000 mL | Freq: Once | INTRAMUSCULAR | Status: DC
  Filled 2016-01-28: qty 20

## 2016-01-28 NOTE — Discharge Instructions (Signed)
Please read and follow all provided instructions.  Your diagnoses today include:  1. Wrist laceration, right, initial encounter    Tests performed today include:  X-ray of the affected area that did not show any foreign bodies or broken bones  Vital signs. See below for your results today.   Medications prescribed:   Take any prescribed medications only as directed.   Home care instructions:  Follow any educational materials and wound care instructions contained in this packet.   You may shower and wash the area with soap and water, just be sure to pat the area dry and not rub over the stitches. Do no put your stiches underwater (in a bath, pool, or lake). Getting stiches wet can slow down healing and increase your chances of getting an infection. You may apply Bacitracin or Neosporin twice a day for 7 days, and keep the ara clean with  bandage or gauze. Do not apply alcohol or hydrogen peroxide. Cover the area if it draining or weeping.   Follow-up instructions: Suture Removal: Return to the Emergency Department or see your primary care care doctor in 8-10 days for a recheck of your wound and removal of your sutures or staples.    Return instructions:  Return to the Emergency Department if you have:  Fever  Worsening pain  Worsening swelling of the wound  Pus draining from the wound  Redness of the skin that moves away from the wound, especially if it streaks away from the affected area   Any other emergent concerns  Your vital signs today were: BP 146/98 mmHg   Pulse 97   Temp(Src) 98.1 F (36.7 C) (Oral)   Resp 16   SpO2 99% If your blood pressure (BP) was elevated above 135/85 this visit, please have this repeated by your doctor within one month. --------------

## 2016-01-28 NOTE — ED Provider Notes (Signed)
CSN: [id number]     Arrival date & time 01/28/16  1644 History  By signing my name below, I, [name], attest that this documentation has been prepared under the direction and in the presence of non-physician practitioner, Audry Pili, PA-C. Electronically Signed: [name], Scribe. 01/28/2016. 5:25 PM.    Chief Complaint  Patient presents with  . Extremity Laceration   The history is provided by the patient. No language interpreter was used.    HPI Comments: Philip Stark is a 50 y.o. male who presents to the Emergency Department complaining of right wrist laceration sustained about an hour and a half ago. Pt reports that he was using a "cutter machine" to cut a piece of metal and injured his right wrist. He notes moderate bleeding but states his bleeding is controlled currently. He endorses 10/10 right wrist pain currently. Pt also reports numbness/tingling in his right thumb, right index finger, and half of his right middle finger. Pt is UTD for tetanus. He was seen at Urgent Care two weeks ago for a right index finger laceration sustained using the same piece of equipment. He denies right arm weakness, fever, or any other associated symptoms.  Past Medical History  Diagnosis Date  . Hyperlipemia   . Cystitis    Past Surgical History  Procedure Laterality Date  . Cochlear implant     No family history on file. Social History  Substance Use Topics  . Smoking status: Never Smoker   . Smokeless tobacco: None  . Alcohol Use: No    Review of Systems  Constitutional: Negative for fever.  Musculoskeletal: Positive for arthralgias (right wrist).  Skin: Positive for wound (right wrist laceration).  Neurological: Positive for numbness. Negative for weakness.  All other systems reviewed and are negative.  Allergies  Ciprofloxacin; Penicillins; Silicone; Sulfa antibiotics; Vancomycin; and Latex  Home Medications   Prior to Admission medications   Medication Sig Start  Date End Date Taking? Authorizing Provider  HYDROcodone-acetaminophen (NORCO/VICODIN) 5-325 MG per tablet Take 1 tablet by mouth every 6 (six) hours as needed for moderate pain. 03/29/14   Charlestine Night, PA-C  ibuprofen (ADVIL,MOTRIN) 800 MG tablet Take 1 tablet (800 mg total) by mouth every 8 (eight) hours as needed. 03/29/14   Christopher Lawyer, PA-C   BP 146/98 mmHg  Pulse 97  Temp(Src) 98.1 F (36.7 C) (Oral)  Resp 16  SpO2 99%   Physical Exam  Constitutional: He is oriented to person, place, and time. He appears well-developed and well-nourished. No distress.  HENT:  Head: Normocephalic and atraumatic.  Eyes: Conjunctivae and EOM are normal.  Neck: Neck supple. No tracheal deviation present.  Cardiovascular: Normal rate.   Pulmonary/Chest: Effort normal. No respiratory distress.  Musculoskeletal: Normal range of motion.  ROM of right hand intact.  Neurological: He is alert and oriented to person, place, and time.  Neurovascularly intact. Grossly normal sensation.  Skin: Skin is warm and dry.  2 cm laceration to the distal lateral aspect of the right wrist. Bleeding controlled. Bottom of wound is visualized. No signs of infection. Good cap refill.  Psychiatric: He has a normal mood and affect. His behavior is normal.  Nursing note and vitals reviewed.  ED Course  .Marland KitchenLaceration Repair Date/Time: 01/28/2016 6:03 PM Performed by: Audry Pili Authorized by: Audry Pili Consent: Verbal consent obtained. Consent given by: patient and parent Patient understanding: patient states understanding of the procedure being performed Patient consent: the patient's understanding of the procedure matches consent given Patient  identity confirmed: verbally with patient and arm band Body area: upper extremity Location details: right wrist Laceration length: 2 cm Foreign bodies: no foreign bodies Tendon involvement: none Nerve involvement: none Vascular damage: no Anesthesia: local  infiltration Local anesthetic: lidocaine 1% without epinephrine Anesthetic total: 1 ml Irrigation solution: saline Irrigation method: jet lavage Amount of cleaning: standard Debridement: minimal Degree of undermining: minimal Skin closure: 4-0 Prolene Number of sutures: 3 Technique: simple Approximation: close Approximation difficulty: simple Dressing: 4x4 sterile gauze and antibiotic ointment Patient tolerance: Patient tolerated the procedure well with no immediate complications   (including critical care time)  DIAGNOSTIC STUDIES: Oxygen Saturation is 99% on RA, normal by my interpretation.    COORDINATION OF CARE: 5:25 PM Discussed treatment plan with pt at bedside and pt agreed to plan.  Labs Review Labs Reviewed - No data to display  Imaging Review Dg Wrist Complete Right  01/28/2016  CLINICAL DATA:  50 year old male with wrist laceration while machine cutting metal. Initial encounter. EXAM: RIGHT WRIST - COMPLETE 3+ VIEW COMPARISON:  None. FINDINGS: Linear soft tissue injury overlying the radial styloid. The depth of the injury is un clear. There are punctate radiopaque foreign bodies identified (arrows). Underlying normal bone mineralization. Distal radius appears intact. Distal ulna intact. Carpal bone alignment preserved. Joint spaces and visible metacarpals intact. IMPRESSION: 1. Linear soft tissue injury at the level of the radial styloid. The depth of the injury is unclear. There are tiny radiopaque retained foreign bodies (arrows). 2.  No acute fracture or dislocation identified. Electronically Signed   By: Odessa FlemingH  Hall M.D.   On: 01/28/2016 17:27   I have personally reviewed and evaluated these images and lab results as part of my medical decision-making.   EKG Interpretation None      MDM  I have reviewed and evaluated the relevant imaging studies.  I have reviewed the relevant previous healthcare records. I obtained HPI from historian.  ED Course:  Assessment:  Patient is a 49yM that presents with laceration to right wrist around 1600 from saw. Tdap UTD. Pressure irrigation performed. Bottom of the wound visualized with bleeding controled. Laceration occurred < 8 hours prior to repair which was well tolerated. Pt has no co morbidities to effect normal wound healing. Imaging showed no acute fractures. Possible foreign bodies noted. Irrigated thoroughly. Discussed suture home care w pt and answered questions. Pt to f-u for wound check and suture removal in 8-10 days. Pt is hemodynamically stable w no complaints prior to dc.    Disposition/Plan:  DC Home Additional Verbal discharge instructions given and discussed with patient.  Pt Instructed to f/u with PCP in the next week for evaluation and treatment of symptoms. Return precautions given Pt acknowledges and agrees with plan  Supervising Physician Tilden FossaElizabeth Rees, MD   Final diagnoses:  Wrist laceration, right, initial encounter   I personally performed the services described in this documentation, which was scribed in my presence. The recorded information has been reviewed and is accurate.    Audry Piliyler Santi Troung, PA-C 01/28/16 1804  Tilden FossaElizabeth Rees, MD 01/29/16 [phone]

## 2016-01-28 NOTE — Progress Notes (Addendum)
Pt c/o all fingers on his right hand are numb. Positive radial pulse and positive capillary refill-5:15pm X-ray at the bedside.PA made aware pt has numbness in all fingers. Per pt he was using a cutting machine . Family remains at the bedside. Minimal bleeding from the right wrist site. 6:15pm Pt s/p sutures and states he now has feeling in all fingers. Dressing applied by PA.

## 2016-01-28 NOTE — ED Notes (Signed)
Pt reports right wrist laceration as result of using "cutter machine." Pt denies numbness, full movement, and right radial pulse present. Event 1600 today.

## 2016-02-28 ENCOUNTER — Emergency Department (HOSPITAL_COMMUNITY)
Admit: 2016-02-28 | Discharge: 2016-02-28 | Disposition: A | Discharge status: Home / Self Care | Payer: Medicare Other | Attending: Emergency Medicine | Admitting: Emergency Medicine

## 2016-02-28 ENCOUNTER — Encounter (HOSPITAL_COMMUNITY): Payer: Self-pay

## 2016-02-28 ENCOUNTER — Emergency Department (HOSPITAL_COMMUNITY)
Admission: EM | Admit: 2016-02-28 | Discharge: 2016-02-28 | Disposition: A | Discharge status: Home / Self Care | Payer: Medicare Other | Attending: Emergency Medicine | Admitting: Emergency Medicine

## 2016-02-28 DIAGNOSIS — M79621 Pain in right upper arm: Secondary | ICD-10-CM | POA: Diagnosis not present

## 2016-02-28 DIAGNOSIS — Z79899 Other long term (current) drug therapy: Secondary | ICD-10-CM | POA: Insufficient documentation

## 2016-02-28 DIAGNOSIS — R252 Cramp and spasm: Secondary | ICD-10-CM | POA: Insufficient documentation

## 2016-02-28 DIAGNOSIS — M79601 Pain in right arm: Secondary | ICD-10-CM | POA: Diagnosis present

## 2016-02-28 LAB — CBC WITH DIFFERENTIAL/PLATELET
Basophils Absolute: 0 10*3/uL (ref 0.0–0.1)
Basophils Relative: 0 %
EOS ABS: 0.1 10*3/uL (ref 0.0–0.7)
EOS PCT: 1 %
HCT: 47.2 % (ref 39.0–52.0)
Hemoglobin: 16.4 g/dL (ref 13.0–17.0)
LYMPHS ABS: 1.7 10*3/uL (ref 0.7–4.0)
LYMPHS PCT: 19 %
MCH: 30.9 pg (ref 26.0–34.0)
MCHC: 34.7 g/dL (ref 30.0–36.0)
MCV: 88.9 fL (ref 78.0–100.0)
MONO ABS: 0.5 10*3/uL (ref 0.1–1.0)
Monocytes Relative: 6 %
Neutro Abs: 6.5 10*3/uL (ref 1.7–7.7)
Neutrophils Relative %: 74 %
PLATELETS: 195 10*3/uL (ref 150–400)
RBC: 5.31 MIL/uL (ref 4.22–5.81)
RDW: 12.7 % (ref 11.5–15.5)
WBC: 8.8 10*3/uL (ref 4.0–10.5)

## 2016-02-28 LAB — BASIC METABOLIC PANEL
Anion gap: 8 (ref 5–15)
BUN: 15 mg/dL (ref 6–20)
CALCIUM: 9 mg/dL (ref 8.9–10.3)
CO2: 25 mmol/L (ref 22–32)
CREATININE: 1.04 mg/dL (ref 0.61–1.24)
Chloride: 106 mmol/L (ref 101–111)
GFR calc Af Amer: >60 mL/min (ref >60)
GLUCOSE: 103 mg/dL — AB (ref 65–99)
Potassium: 4.1 mmol/L (ref 3.5–5.1)
SODIUM: 139 mmol/L (ref 135–145)

## 2016-02-28 MED ORDER — DIAZEPAM 5 MG/ML IJ SOLN
5.0000 mg | Freq: Once | INTRAMUSCULAR | Status: AC
  Administered 2016-02-28: 5 mg via INTRAVENOUS
  Filled 2016-02-28: qty 2

## 2016-02-28 MED ORDER — METHOCARBAMOL 500 MG PO TABS: 500.0000 mg | ORAL_TABLET | Freq: Two times a day (BID) | ORAL | 0 refills | Status: DC

## 2016-02-28 MED ORDER — IBUPROFEN 600 MG PO TABS: 600.0000 mg | ORAL_TABLET | Freq: Four times a day (QID) | ORAL | 0 refills | Status: DC | PRN

## 2016-02-28 NOTE — ED Notes (Signed)
Bed: WA19 Expected date:  Expected time:  Means of arrival:  Comments: 

## 2016-02-28 NOTE — Discharge Instructions (Signed)
Try stretching your hand and fingers at home. Try heating pads. It should to drink plenty of fluids. Take ibuprofen for pain and inflammation. Take Robaxin for muscle spasms as prescribed. Please follow-up with the family doctor. Avoid strenuous activity the next 2-3 days.

## 2016-02-28 NOTE — Progress Notes (Signed)
*  PRELIMINARY RESULTS* Vascular Ultrasound Right upper extremity venous duplex has been completed.  Preliminary findings: No evidence of DVT or superficial thrombosis.    Farrel DemarkJill Eunice, RDMS, RVT  02/28/2016, 3:12 PM

## 2016-02-28 NOTE — ED Notes (Signed)
Bed: [facility] Expected date:  Expected time:  Means of arrival:  Comments: Triaging in room  

## 2016-02-28 NOTE — ED Triage Notes (Addendum)
Per EMS, Pt, from work, c/o R arm pain w/ intermittent upper and lower arm spasms starting today.  Pain score 10/10.  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  Fentanyl given en route.       Pt had a recent laceration to R wrist.  Stitches have been removed and area seems to be healing.

## 2016-02-28 NOTE — ED Provider Notes (Signed)
[facility]-EMERGENCY DEPT Provider Note   CSN: [id number] Arrival date & time: 02/28/16  1045     History   Chief Complaint Chief Complaint  Patient presents with  . Arm Pain  . Spasms    HPI Philip Sladenthony J Crow is a 50 y.o. male.  HPI Philip Stark is a 50 y.o. male presents to emergency department complaining of right arm spasms. Patient sustained laceration to the right wrist approximately 2 weeks ago. He was seen here, the time laceration was repaired. Patient has been doing well since then. He states about 2 days ago he noticed some pain in the right forearm. He states he felt like there was a swollen bump. Today he was at work and was performing pulling motion when he grips and states he developed severe sharp pain in her right forearm and states his fingers contracted together. He was unable to straighten his fingers. His boss brought him to emergency department. Patient denies any numbness or weakness to his hand. He denies any other injuries. No fever or chills.  Past Medical History:  Diagnosis Date  . Cystitis   . Hyperlipemia     There are no active problems to display for this patient.   Past Surgical History:  Procedure Laterality Date  . COCHLEAR IMPLANT         Home Medications    Prior to Admission medications   Medication Sig Start Date End Date Taking? Authorizing Provider  simvastatin (ZOCOR) 40 MG tablet Take 40 mg by mouth daily.   Yes Historical Provider, MD  solifenacin (VESICARE) 5 MG tablet Take 5 mg by mouth at bedtime. 08/14/15  Yes Historical Provider, MD    Family History History reviewed. No pertinent family history.  Social History Social History  Substance Use Topics  . Smoking status: Never Smoker  . Smokeless tobacco: Never Used  . Alcohol use Yes     Comment: occ     Allergies   Ciprofloxacin; Penicillins; Silicone; Sulfa antibiotics; Vancomycin; and Latex   Review of Systems Review of Systems  Constitutional: Negative  for chills and fever.  Musculoskeletal: Positive for arthralgias and myalgias.  Neurological: Negative for weakness and numbness.  All other systems reviewed and are negative.    Physical Exam Updated Vital Signs BP 130/91 (BP Location: Left Arm)   Pulse 92   Temp 98.1 F (36.7 C) (Oral)   Resp 16   SpO2 96%   Physical Exam  Constitutional: He appears well-developed and well-nourished. No distress.  Cardiovascular: Normal rate and regular rhythm.   Pulmonary/Chest: Breath sounds normal.  Musculoskeletal:  Tenderness to palpation over anterior proximal forearm. No swelling, bruising, erythema. Patient developed a spasm as I was making him grip my fingers. Distal radial pulses intact. Sensation and strength intact in the right hand.  Nursing note and vitals reviewed.    ED Treatments / Results  Labs (all labs ordered are listed, but only abnormal results are displayed) Labs Reviewed  BASIC METABOLIC PANEL - Abnormal; Notable for the following:       Result Value   Glucose, Bld 103 (*)    All other components within normal limits  CBC WITH DIFFERENTIAL/PLATELET    EKG  EKG Interpretation None       Radiology No results found.  Procedures Procedures (including critical care time) Vascular Ultrasound Right upper extremity venous duplex has been completed.  Preliminary findings: No evidence of DVT or superficial thrombosis.  Medications Ordered in ED Medications  diazepam (VALIUM)  injection 5 mg (5 mg Intravenous Given 02/28/16 1336)     Initial Impression / Assessment and Plan / ED Course  I have reviewed the triage vital signs and the nursing notes.  Pertinent labs & imaging results that were available during my care of the patient were reviewed by me and considered in my medical decision making (see chart for details).  Clinical Course  Comment By Time  Pt has not had any more spasms in the arm. Pain improved. Labs normal. Pending Venous dopler. Family and  pt informed Jaynie Crumbleatyana Ashaz Robling, PA-C 08/15 1423  Venous Doppler did not show any evidence of clots. Patient continues to feel improved. Most likely muscle spasms. Will discharge home with Robaxin and follow up as needed. Jaynie Crumbleatyana Kynzlie Hilleary, PA-C 08/15 1525     Final Clinical Impressions(s) / ED Diagnoses   Final diagnoses:  Muscle cramps    New Prescriptions Discharge Medication List as of 02/28/2016  3:28 PM    START taking these medications   Details  ibuprofen (ADVIL,MOTRIN) 600 MG tablet Take 1 tablet (600 mg total) by mouth every 6 (six) hours as needed., Starting Tue 02/28/2016, Print    methocarbamol (ROBAXIN) 500 MG tablet Take 1 tablet (500 mg total) by mouth 2 (two) times daily., Starting Tue 02/28/2016, Print         Jaynie Crumbleatyana Gareth Fitzner, PA-C 02/28/16 2034    Alvira MondayErin Schlossman, MD 03/01/16 [phone]

## 2016-07-20 ENCOUNTER — Ambulatory Visit (INDEPENDENT_AMBULATORY_CARE_PROVIDER_SITE_OTHER): Payer: Medicare Other | Admitting: Podiatry

## 2016-07-20 ENCOUNTER — Encounter: Payer: Self-pay | Admitting: Podiatry

## 2016-07-20 VITALS — BP 117/84 | HR 82 | Resp 16 | Ht 68.0 in | Wt 150.0 lb

## 2016-07-20 DIAGNOSIS — L600 Ingrowing nail: Secondary | ICD-10-CM | POA: Diagnosis not present

## 2016-07-20 NOTE — Progress Notes (Signed)
   Subjective:    Patient ID: Philip Stark, male    DOB: 1966-04-21, 51 y.o.   MRN: [id number]  HPI Chief Complaint  Patient presents with  . Nail Problem    Right foot; 2nd toe-lateral side; x2 weeks      Review of Systems  HENT: Positive for hearing loss.   Skin: Positive for rash.  All other systems reviewed and are negative.      Objective:   Physical Exam        Assessment & Plan:

## 2016-07-20 NOTE — Patient Instructions (Signed)

## 2016-07-23 NOTE — Progress Notes (Signed)
Subjective:     Patient ID: Philip Stark, male   DOB: [birth year], 51 y.o.   MRN: [id number]  HPI patient presents with his mother with damaged second nail right foot that's thick and yellow and loose. States these tried to trim it and soak it without relief and presents stating he cannot take the pain of this   Review of Systems  All other systems reviewed and are negative.      Objective:   Physical Exam  Constitutional: He is oriented to person, place, and time.  Cardiovascular: Intact distal pulses.   Musculoskeletal: Normal range of motion.  Neurological: He is oriented to person, place, and time.  Skin: Skin is warm.  Nursing note and vitals reviewed.  neurovascular status intact muscle strength adequate range of motion within normal limits with patient noted to have a thickened incurvated second nail right that's painful dorsally and is making shoe gear difficult. Patient states that it's become increasingly thickened and I did note no drainage currently with slight distal redness. Good digital perfusion is noted and well oriented 3     Assessment:     Damaged second nail right with probable long-term trauma to the bed    Plan:     H&P conditions reviewed and discussed treatment options. They want it removed and I explained procedure and risk and they want surgery. Today I infiltrated the right second digit 60 mg Xylocaine Marcaine mixture remove the nail exposed the matrix and applied phenol 5 applications 30 seconds followed by alcohol lavage and sterile dressing. Gave instructions on soaks and reappoint

## 2016-08-29 ENCOUNTER — Ambulatory Visit (INDEPENDENT_AMBULATORY_CARE_PROVIDER_SITE_OTHER): Payer: Medicare Other | Admitting: Podiatry

## 2016-08-29 ENCOUNTER — Ambulatory Visit (INDEPENDENT_AMBULATORY_CARE_PROVIDER_SITE_OTHER): Payer: Medicare Other

## 2016-08-29 DIAGNOSIS — L03031 Cellulitis of right toe: Secondary | ICD-10-CM

## 2016-08-29 DIAGNOSIS — M79674 Pain in right toe(s): Secondary | ICD-10-CM

## 2016-08-29 MED ORDER — AZITHROMYCIN 250 MG PO TABS: ORAL_TABLET | ORAL | 0 refills | Status: DC

## 2016-08-29 NOTE — Progress Notes (Signed)
Subjective:     Patient ID: Philip Stark, male   DOB: October 10, 1965, 51 y.o.   MRN: [id number]  HPI patient presents with mother stating that he has traumatized his right second toe in number at times and it's hard for him to wear shoe gear comfortably   Review of Systems     Objective:   Physical Exam Neurovascular status was intact with patient found to have irritated right second digit more in the interphalangeal joint with the nail that we took off crusted over with no drainage or odor noted    Assessment:     Probable trauma with mild possible paronychia infection right second digit    Plan:     H&P condition reviewed and at this point I recommended soaks therapy to be continued and I placed on Z-Pak and instructed on wider shoes. This should be uneventful but if any issues were to occur patient is to let us know  X-ray report negative for signs of fracture of the distal digits second right or any osteolytic changes

## 2017-03-18 ENCOUNTER — Emergency Department (HOSPITAL_COMMUNITY)
Admission: EM | Admit: 2017-03-18 | Discharge: 2017-03-18 | Disposition: A | Discharge status: Home / Self Care | Payer: Medicare Other | Attending: Emergency Medicine | Admitting: Emergency Medicine

## 2017-03-18 ENCOUNTER — Encounter (HOSPITAL_COMMUNITY): Payer: Self-pay | Admitting: Emergency Medicine

## 2017-03-18 DIAGNOSIS — Z79899 Other long term (current) drug therapy: Secondary | ICD-10-CM | POA: Diagnosis not present

## 2017-03-18 DIAGNOSIS — R1030 Lower abdominal pain, unspecified: Secondary | ICD-10-CM | POA: Diagnosis present

## 2017-03-18 DIAGNOSIS — N390 Urinary tract infection, site not specified: Secondary | ICD-10-CM | POA: Insufficient documentation

## 2017-03-18 DIAGNOSIS — Z91040 Latex allergy status: Secondary | ICD-10-CM | POA: Diagnosis not present

## 2017-03-18 LAB — URINALYSIS, ROUTINE W REFLEX MICROSCOPIC
Bilirubin Urine: NEGATIVE
GLUCOSE, UA: NEGATIVE mg/dL
Ketones, ur: NEGATIVE mg/dL
Nitrite: POSITIVE — AB
PROTEIN: 30 mg/dL — AB
Specific Gravity, Urine: 1.015 (ref 1.005–1.030)
pH: 8 (ref 5.0–8.0)

## 2017-03-18 MED ORDER — CEPHALEXIN 500 MG PO CAPS
500.0000 mg | ORAL_CAPSULE | Freq: Once | ORAL | Status: AC
  Administered 2017-03-18: 500 mg via ORAL
  Filled 2017-03-18: qty 1

## 2017-03-18 MED ORDER — CEPHALEXIN 500 MG PO CAPS: 500.0000 mg | ORAL_CAPSULE | Freq: Four times a day (QID) | ORAL | 0 refills | Status: AC

## 2017-03-18 NOTE — Discharge Instructions (Signed)
Please read and follow all provided instructions.  Your diagnoses today include:  1. Urinary tract infection without hematuria, site unspecified     Tests performed today include: Urine test - suggests that you have an infection in your bladder Vital signs. See below for your results today.   Medications prescribed:  Take as prescribed  Home care instructions:  Follow any educational materials contained in this packet.  Follow-up instructions: Please follow-up with your primary care provider in 3 days if symptoms are not resolved for further evaluation of your symptoms.  Return instructions:  Please return to the Emergency Department if you experience worsening symptoms.  Return with fever, worsening pain, persistent vomiting, worsening pain in your back.  Please return if you have any other emergent concerns.  Additional Information:  Your vital signs today were: BP (!) 129/91 (BP Location: Left Arm)    Pulse 98    Temp 98.5 F (36.9 C) (Oral)    Resp 18    SpO2 100%  If your blood pressure (BP) was elevated above 135/85 this visit, please have this repeated by your doctor within one month. --------------

## 2017-03-18 NOTE — ED Provider Notes (Signed)
[facility]-EMERGENCY DEPT Provider Note   CSN: [id number] Arrival date & time: 03/18/17  1713     History   Chief Complaint Chief Complaint  Patient presents with  . Flank Pain    HPI Trula Sladenthony J Celia is a 51 y.o. male.  HPI  51 y.o. male, presents to the Emergency Department today due to mild left sided flank pain with onset today. Pt states that he was changing his foley catheter when he noticed some mild pus like discharge on removal. Notes mild suprapubic pain. No back pain. Rates 3/10. Cramping sensation. No N/V. No fevers. Pt has been changing foley catheter for 7 years now. No CP/SOB. No headaches. No other symptoms noted.     Past Medical History:  Diagnosis Date  . Cystitis   . Hyperlipemia     There are no active problems to display for this patient.   Past Surgical History:  Procedure Laterality Date  . COCHLEAR IMPLANT         Home Medications    Prior to Admission medications   Medication Sig Start Date End Date Taking? Authorizing Provider  azithromycin (ZITHROMAX) 250 MG tablet Take as directed 08/29/16   Lenn Sinkegal, Norman S, DPM  ibuprofen (ADVIL,MOTRIN) 600 MG tablet Take 1 tablet (600 mg total) by mouth every 6 (six) hours as needed. 02/28/16   Kirichenko, Tatyana, PA-C  simvastatin (ZOCOR) 40 MG tablet Take 40 mg by mouth daily.    [provider]  solifenacin (VESICARE) 5 MG tablet Take 5 mg by mouth at bedtime. 08/14/15   [provider]    Family History History reviewed. No pertinent family history.  Social History Social History  Substance Use Topics  . Smoking status: Never Smoker  . Smokeless tobacco: Never Used  . Alcohol use Yes     Comment: occ     Allergies   Ciprofloxacin; Penicillins; Silicone; Sulfa antibiotics; Vancomycin; Clindamycin/lincomycin; and Latex   Review of Systems Review of Systems ROS reviewed and all are negative for acute change except as noted in the HPI.  Physical Exam Updated Vital  Signs BP (!) 132/100   Pulse 96   Temp 98.4 F (36.9 C) (Oral)   Resp 16   SpO2 99%   Physical Exam  Constitutional: He is oriented to person, place, and time. Vital signs are normal. He appears well-developed and well-nourished.  HENT:  Head: Normocephalic and atraumatic.  Right Ear: Hearing normal.  Left Ear: Hearing normal.  Eyes: Pupils are equal, round, and reactive to light. Conjunctivae and EOM are normal.  Neck: Normal range of motion. Neck supple.  Cardiovascular: Normal rate, regular rhythm, normal heart sounds and intact distal pulses.   Pulmonary/Chest: Effort normal and breath sounds normal.  Abdominal: Soft. Normal appearance and bowel sounds are normal. There is no rigidity, no rebound, no guarding, no CVA tenderness, no tenderness at McBurney's point and negative Murphy's sign.  Abdomen soft.  Genitourinary:  Genitourinary Comments: Indwelling foley catheter noted. Good output.   Musculoskeletal: Normal range of motion.  Neurological: He is alert and oriented to person, place, and time.  Skin: Skin is warm and dry.  Psychiatric: He has a normal mood and affect. His speech is normal and behavior is normal. Thought content normal.  Nursing note and vitals reviewed.    ED Treatments / Results  Labs (all labs ordered are listed, but only abnormal results are displayed) Labs Reviewed  URINALYSIS, ROUTINE W REFLEX MICROSCOPIC - Abnormal; Notable for the following:  Result Value   APPearance CLOUDY (*)    Hgb urine dipstick SMALL (*)    Protein, ur 30 (*)    Nitrite POSITIVE (*)    Leukocytes, UA LARGE (*)    Bacteria, UA FEW (*)    Squamous Epithelial / LPF 0-5 (*)    All other components within normal limits  URINE CULTURE    EKG  EKG Interpretation None       Radiology No results found.  Procedures Procedures (including critical care time)  Medications Ordered in ED Medications - No data to display   Initial Impression / Assessment and  Plan / ED Course  I have reviewed the triage vital signs and the nursing notes.  Pertinent labs & imaging results that were available during my care of the patient were reviewed by me and considered in my medical decision making (see chart for details).  Final Clinical Impressions(s) / ED Diagnoses  {I have reviewed and evaluated the relevant laboratory values.   {I have reviewed the relevant previous healthcare records.  {I obtained HPI from historian.   ED Course:  Assessment: Pt is a 51 y.o. male presents to the Emergency Department today due to mild left sided flank pain with onset today. Pt states that he was changing his foley catheter when he noticed some mild pus like discharge on removal. Notes mild suprapubic pain. No back pain. Rates 3/10. Cramping sensation. No N/V. No fevers. Pt has been changing foley catheter for 7 years now. No CP/SOB. No headaches. On exam, pt in NAD. Nontoxic/nonseptic appearing. VSS. Afebrile. Lungs CTA. Heart RRR. Abdomen mild suprapubic tenderness. Foley with good output. UA shows UTI. Culture sent. Consult to Pharmacy. Offered IV dose Rocephin, but patient requested PO ABX so he could go home. Dsicussed risks and patient agreed. Return precautions given. Given Keflex in ED. Plan is to DC home with follow up to Urology. At time of discharge, Patient is in no acute distress. Vital Signs are stable. Patient is able to ambulate. Patient able to tolerate PO.   Disposition/Plan:  DC Home Additional Verbal discharge instructions given and discussed with patient.  Pt Instructed to f/u with Urology in the next week for evaluation and treatment of symptoms. Return precautions given Pt acknowledges and agrees with plan  Supervising Physician Rolan Bucco, MD  Final diagnoses:  Urinary tract infection without hematuria, site unspecified    New Prescriptions New Prescriptions   No medications on file     Audry Pili, Cordelia Poche 03/18/17 2043    Rolan Bucco,  MD 03/18/17 2216

## 2017-03-18 NOTE — ED Triage Notes (Signed)
Pt states that he changed his catheter out at home and now has L sided flank pain and he thinks she saw some discharge when he removed the catheter. Alert and oriented.

## 2017-03-21 LAB — URINE CULTURE

## 2018-09-26 ENCOUNTER — Other Ambulatory Visit: Payer: Self-pay | Admitting: Urology

## 2018-10-06 ENCOUNTER — Encounter (HOSPITAL_COMMUNITY): Payer: Self-pay

## 2018-10-06 NOTE — Patient Instructions (Addendum)
PARTAP JUDSON  10/06/2018       Your procedure is scheduled on:   10-13-2018   Report to [facility] Main  Entrance,  Report to Short Stay  at  5:30 AM    Call this number if you have problems the morning of surgery [phone]         Remember: Do not eat food or drink liquids :After Midnight.   This includes no water, candy, gum, mints  BRUSH YOUR TEETH MORNING OF SURGERY AND RINSE YOUR MOUTH OUT.         Take these medicines the morning of surgery with A SIP OF WATER:   none                                    You may not have any metal on your body including piercings              Do not wear jewelry, lotions, powders or perfumes, deodorant                          Men may shave face and neck.        Do not bring valuables to the hospital. [facility] IS NOT             RESPONSIBLE   FOR VALUABLES.  Contacts, dentures or bridgework may not be worn into surgery.  Leave suitcase in the car. After surgery it may be brought to your room.      _____________________________________________________________________             [facility] - Preparing for Surgery Before surgery, you can play an important role.  Because skin is not sterile, your skin needs to be as free of germs as possible.  You can reduce the number of germs on your skin by washing with CHG (chlorahexidine gluconate) soap before surgery.  CHG is an antiseptic cleaner which kills germs and bonds with the skin to continue killing germs even after washing. Please DO NOT use if you have an allergy to CHG or antibacterial soaps.  If your skin becomes reddened/irritated stop using the CHG and inform your nurse when you arrive at Short Stay. Do not shave (including legs and underarms) for at least 48 hours prior to the first CHG shower.  You may shave your face/neck. Please follow these instructions carefully:  1.  Shower with CHG Soap the night before surgery and the   morning of Surgery.  2.  If you choose to wash your hair, wash your hair first as usual with your  normal  shampoo.  3.  After you shampoo, rinse your hair and body thoroughly to remove the  shampoo.                            4.  Use CHG as you would any other liquid soap.  You can apply chg directly  to the skin and wash                       Gently with a scrungie or clean washcloth.  5.  Apply the CHG Soap to your body ONLY FROM THE NECK DOWN.   Do not use on  face/ open                           Wound or open sores. Avoid contact with eyes, ears mouth and genitals (private parts).                       Wash face,  Genitals (private parts) with your normal soap.             6.  Wash thoroughly, paying special attention to the area where your surgery  will be performed.  7.  Thoroughly rinse your body with warm water from the neck down.  8.  DO NOT shower/wash with your normal soap after using and rinsing off  the CHG Soap.             9.  Pat yourself dry with a clean towel.            10.  Wear clean pajamas.            11.  Place clean sheets on your bed the night of your first shower and do not  sleep with pets. Day of Surgery : Do not apply any lotions/deodorants the morning of surgery.  Please wear clean clothes to the hospital/surgery center.  FAILURE TO FOLLOW THESE INSTRUCTIONS MAY RESULT IN THE CANCELLATION OF YOUR SURGERY PATIENT SIGNATURE_________________________________  NURSE SIGNATURE__________________________________  ________________________________________________________________________

## 2018-10-07 ENCOUNTER — Encounter (HOSPITAL_COMMUNITY)
Admission: RE | Admit: 2018-10-07 | Discharge: 2018-10-07 | Disposition: A | Discharge status: Home / Self Care | Payer: Medicare Other | Source: Ambulatory Visit | Attending: Urology | Admitting: Urology

## 2018-10-07 ENCOUNTER — Encounter (HOSPITAL_COMMUNITY): Payer: Self-pay

## 2018-10-07 ENCOUNTER — Other Ambulatory Visit: Payer: Self-pay

## 2018-10-07 DIAGNOSIS — Z79899 Other long term (current) drug therapy: Secondary | ICD-10-CM | POA: Insufficient documentation

## 2018-10-07 DIAGNOSIS — E785 Hyperlipidemia, unspecified: Secondary | ICD-10-CM | POA: Diagnosis not present

## 2018-10-07 DIAGNOSIS — Z01812 Encounter for preprocedural laboratory examination: Secondary | ICD-10-CM | POA: Diagnosis not present

## 2018-10-07 HISTORY — DX: Hyperlipidemia, unspecified: E78.5

## 2018-10-07 HISTORY — DX: Developmental disorder of scholastic skills, unspecified: F81.9

## 2018-10-07 HISTORY — DX: Presence of urogenital implants: Z96.0

## 2018-10-07 HISTORY — DX: Other chronic tubulo-interstitial nephritis: N11.8

## 2018-10-07 HISTORY — DX: Cochlear implant status: Z96.21

## 2018-10-07 HISTORY — DX: Unspecified hearing loss, unspecified ear: H91.90

## 2018-10-07 HISTORY — DX: Reserved for inherently not codable concepts without codable children: IMO0001

## 2018-10-07 HISTORY — DX: Presence of other specified devices: Z97.8

## 2018-10-07 LAB — CBC
HCT: 54.3 % — ABNORMAL HIGH (ref 39.0–52.0)
Hemoglobin: 17.8 g/dL — ABNORMAL HIGH (ref 13.0–17.0)
MCH: 31 pg (ref 26.0–34.0)
MCHC: 32.8 g/dL (ref 30.0–36.0)
MCV: 94.4 fL (ref 80.0–100.0)
PLATELETS: 225 10*3/uL (ref 150–400)
RBC: 5.75 MIL/uL (ref 4.22–5.81)
RDW: 12.4 % (ref 11.5–15.5)
WBC: 6.2 10*3/uL (ref 4.0–10.5)
nRBC: 0 % (ref 0.0–0.2)

## 2018-10-07 LAB — BASIC METABOLIC PANEL
ANION GAP: 8 (ref 5–15)
BUN: 16 mg/dL (ref 6–20)
CO2: 25 mmol/L (ref 22–32)
Calcium: 9.4 mg/dL (ref 8.9–10.3)
Chloride: 105 mmol/L (ref 98–111)
Creatinine, Ser: 0.9 mg/dL (ref 0.61–1.24)
GFR calc Af Amer: >60 mL/min (ref >60)
GLUCOSE: 106 mg/dL — AB (ref 70–99)
Potassium: 4.4 mmol/L (ref 3.5–5.1)
Sodium: 138 mmol/L (ref 135–145)

## 2018-10-07 MED ORDER — MAGNESIUM CITRATE PO SOLN
1.0000 | Freq: Once | ORAL | Status: DC
  Filled 2018-10-07: qty 296

## 2018-10-07 NOTE — Progress Notes (Signed)
Anesthesia Chart Review   Case:  [id number] Date/Time:  10/13/18 0700   Procedure:  XI ROBOTIC ASSISTED LAPAROSCOPIC NEPHRECTOMY (Right ) - 2.5 HRS   Anesthesia type:  General   Pre-op diagnosis:  HISTORY OF NON FUNCTIONING RIGHT KIDNEY   Location:  [facility] ROOM 03 / [facility]   Surgeon:  Malen Gauze, MD      DISCUSSION:53 yo never smoker with h/o learning disabilities, hearing impaired with cochlear implant in place, HLD, chronic indwelling catheter since 2007, non-functioning right kidney scheduled for above procedure 10/13/18 with Dr. Wilkie Aye.   Pt can proceed with planned procedure barring acute status change.  VS: There were no vitals taken for this visit.  PROVIDERS: Maurice Small, MD is PCP    LABS: Labs reviewed: Acceptable for surgery. (all labs ordered are listed, but only abnormal results are displayed)  Labs Reviewed  BASIC METABOLIC PANEL - Abnormal; Notable for the following components:      Result Value   Glucose, Bld 106 (*)    All other components within normal limits  CBC - Abnormal; Notable for the following components:   Hemoglobin 17.8 (*)    HCT 54.3 (*)    All other components within normal limits     IMAGES:   EKG:   CV: Echo 05/19/2009 Study Conclusions Left ventricle: Very poor apical windows limit study. Overall LVEFis probablymoderately depressed. The anterior wall isnot well seen. The inferior, septal, distal lateral, posterior and apical walls appear hypokinetic. The cavity size was normal. Wall thickness was normal.  Transthoracic echocardiography. M-mode, complete 2D, spectral Doppler, and color Doppler. Patient status: Inpatient. Location: Bedside. Past Medical History:  Diagnosis Date  . Cochlear implant in place   . Cystitis   . Hearing impaired    s/p cochlear implant,  pt can read lips  . Hyperlipidemia   . Learning disabilities   . Xanthogranulomatous pyelonephritis    right    Past Surgical History:   Procedure Laterality Date  . COCHLEAR IMPLANT      MEDICATIONS: . simvastatin (ZOCOR) 40 MG tablet  . solifenacin (VESICARE) 5 MG tablet   No current facility-administered medications for this encounter.      Janey Genta [facility] Pre-Surgical Testing [phone] 10/07/18 12:36 PM

## 2018-10-07 NOTE — Anesthesia Preprocedure Evaluation (Addendum)
Anesthesia Evaluation  Patient identified by MRN, date of birth, ID band Patient awake    Reviewed: Allergy & Precautions, NPO status , Patient's Chart, lab work & pertinent test results  Airway Mallampati: II       Dental  (+) Poor Dentition   Pulmonary neg pulmonary ROS,    Pulmonary exam normal        Cardiovascular negative cardio ROS Normal cardiovascular exam     Neuro/Psych negative neurological ROS     GI/Hepatic negative GI ROS, Neg liver ROS,   Endo/Other  negative endocrine ROS  Renal/GU   negative genitourinary   Musculoskeletal negative musculoskeletal ROS (+)   Abdominal Normal abdominal exam  (+)   Peds  Hematology negative hematology ROS (+)   Anesthesia Other Findings   Reproductive/Obstetrics                             Anesthesia Physical Anesthesia Plan  ASA: II  Anesthesia Plan: General   Post-op Pain Management:    Induction: Intravenous  PONV Risk Score and Plan: 3 and Ondansetron, Dexamethasone and Midazolam  Airway Management Planned: Oral ETT  Additional Equipment:   Intra-op Plan:   Post-operative Plan: Extubation in OR  Informed Consent: I have reviewed the patients History and Physical, chart, labs and discussed the procedure including the risks, benefits and alternatives for the proposed anesthesia with the patient or authorized representative who has indicated his/her understanding and acceptance.     Dental advisory given  Plan Discussed with: CRNA  Anesthesia Plan Comments: (See PAT note 10/07/18, Jodell Cipro, PA-C)      Anesthesia Quick Evaluation

## 2018-10-10 NOTE — Progress Notes (Addendum)
SPOKE W/  _his mother via phone     SCREENING SYMPTOMS OF COVID 19:   COUGH---no  RUNNY NOSE--- no  SORE THROAT---no  SHORTNESS OF BREATH---no  DIFFICULTY BREATHING---no  TEMP >100.4-----no  HAVE YOU OR ANY FAMILY MEMBER TRAVELLED PAST 14 DAYS OUT OF THE   COUNTY---no STATE----no COUNTRY----no  HAVE YOU OR ANY FAMILY MEMBER BEEN EXPOSED TO ANYONE WITH COVID 19? No   Patient is deaf and has learning disabilities. His mother is concerned about this  and wants to be able to be with him during his hospitalization. As per most recent email , no visitors for patients over the age of 36. Patient must wear cochlear devices to hear in PACU as nurses will be wearing masks and protective eye wear.

## 2018-10-13 ENCOUNTER — Inpatient Hospital Stay (HOSPITAL_COMMUNITY): Payer: Medicare Other | Admitting: Anesthesiology

## 2018-10-13 ENCOUNTER — Other Ambulatory Visit: Payer: Self-pay

## 2018-10-13 ENCOUNTER — Inpatient Hospital Stay (HOSPITAL_COMMUNITY)
Admission: RE | Admit: 2018-10-13 | Discharge: 2018-10-16 | DRG: 660 | Disposition: A | Discharge status: Home / Self Care | Payer: Medicare Other | Attending: Urology | Admitting: Urology

## 2018-10-13 ENCOUNTER — Inpatient Hospital Stay (HOSPITAL_COMMUNITY): Payer: Medicare Other | Admitting: Physician Assistant

## 2018-10-13 ENCOUNTER — Encounter (HOSPITAL_COMMUNITY): Payer: Self-pay | Admitting: *Deleted

## 2018-10-13 ENCOUNTER — Encounter (HOSPITAL_COMMUNITY)
Admission: RE | Disposition: A | Discharge status: Home / Self Care | Payer: Self-pay | Source: Home / Self Care | Attending: Urology

## 2018-10-13 DIAGNOSIS — F819 Developmental disorder of scholastic skills, unspecified: Secondary | ICD-10-CM | POA: Diagnosis present

## 2018-10-13 DIAGNOSIS — N12 Tubulo-interstitial nephritis, not specified as acute or chronic: Secondary | ICD-10-CM | POA: Diagnosis present

## 2018-10-13 DIAGNOSIS — Z881 Allergy status to other antibiotic agents status: Secondary | ICD-10-CM

## 2018-10-13 DIAGNOSIS — Z7289 Other problems related to lifestyle: Secondary | ICD-10-CM | POA: Diagnosis not present

## 2018-10-13 DIAGNOSIS — Z91040 Latex allergy status: Secondary | ICD-10-CM | POA: Diagnosis not present

## 2018-10-13 DIAGNOSIS — H9190 Unspecified hearing loss, unspecified ear: Secondary | ICD-10-CM | POA: Diagnosis present

## 2018-10-13 DIAGNOSIS — Z880 Allergy status to penicillin: Secondary | ICD-10-CM | POA: Diagnosis not present

## 2018-10-13 DIAGNOSIS — E785 Hyperlipidemia, unspecified: Secondary | ICD-10-CM | POA: Diagnosis present

## 2018-10-13 DIAGNOSIS — N138 Other obstructive and reflux uropathy: Secondary | ICD-10-CM | POA: Diagnosis present

## 2018-10-13 DIAGNOSIS — Z9621 Cochlear implant status: Secondary | ICD-10-CM | POA: Diagnosis present

## 2018-10-13 DIAGNOSIS — N118 Other chronic tubulo-interstitial nephritis: Secondary | ICD-10-CM | POA: Diagnosis present

## 2018-10-13 DIAGNOSIS — Z882 Allergy status to sulfonamides status: Secondary | ICD-10-CM

## 2018-10-13 HISTORY — PX: ROBOT ASSISTED LAPAROSCOPIC NEPHRECTOMY: SHX5140

## 2018-10-13 LAB — HEMOGLOBIN AND HEMATOCRIT, BLOOD
HCT: 47.1 % (ref 39.0–52.0)
Hemoglobin: 15.6 g/dL (ref 13.0–17.0)

## 2018-10-13 SURGERY — NEPHRECTOMY, RADICAL, ROBOT-ASSISTED, LAPAROSCOPIC, ADULT
Anesthesia: General | Laterality: Right

## 2018-10-13 MED ORDER — HYDROMORPHONE HCL 1 MG/ML IJ SOLN
INTRAMUSCULAR | Status: AC
  Administered 2018-10-13: 12:00:00
  Filled 2018-10-13: qty 1

## 2018-10-13 MED ORDER — BACITRACIN-NEOMYCIN-POLYMYXIN 400-5-5000 EX OINT: 1.0000 "application " | TOPICAL_OINTMENT | Freq: Three times a day (TID) | CUTANEOUS | Status: DC | PRN

## 2018-10-13 MED ORDER — KETOROLAC TROMETHAMINE 30 MG/ML IJ SOLN
30.0000 mg | Freq: Once | INTRAMUSCULAR | Status: AC | PRN
  Administered 2018-10-13: 30 mg via INTRAVENOUS

## 2018-10-13 MED ORDER — HYDROMORPHONE HCL 1 MG/ML IJ SOLN
0.5000 mg | INTRAMUSCULAR | Status: DC | PRN
  Administered 2018-10-14 – 2018-10-15 (×6): 1 mg via INTRAVENOUS
  Filled 2018-10-13 (×6): qty 1

## 2018-10-13 MED ORDER — PHENYLEPHRINE 40 MCG/ML (10ML) SYRINGE FOR IV PUSH (FOR BLOOD PRESSURE SUPPORT)
PREFILLED_SYRINGE | INTRAVENOUS | Status: AC
  Filled 2018-10-13: qty 10

## 2018-10-13 MED ORDER — ROCURONIUM BROMIDE 10 MG/ML (PF) SYRINGE
PREFILLED_SYRINGE | INTRAVENOUS | Status: AC
  Filled 2018-10-13: qty 10

## 2018-10-13 MED ORDER — EPHEDRINE 5 MG/ML INJ
INTRAVENOUS | Status: AC
  Filled 2018-10-13: qty 10

## 2018-10-13 MED ORDER — DOCUSATE SODIUM 100 MG PO CAPS
100.0000 mg | ORAL_CAPSULE | Freq: Two times a day (BID) | ORAL | Status: DC
  Administered 2018-10-13 – 2018-10-16 (×5): 100 mg via ORAL
  Filled 2018-10-13 (×5): qty 1

## 2018-10-13 MED ORDER — HYDROCODONE-ACETAMINOPHEN 5-325 MG PO TABS: 1.0000 | ORAL_TABLET | Freq: Four times a day (QID) | ORAL | 0 refills | Status: DC | PRN

## 2018-10-13 MED ORDER — EPHEDRINE SULFATE-NACL 50-0.9 MG/10ML-% IV SOSY
PREFILLED_SYRINGE | INTRAVENOUS | Status: DC | PRN
  Administered 2018-10-13 (×2): 10 mg via INTRAVENOUS

## 2018-10-13 MED ORDER — FENTANYL CITRATE (PF) 250 MCG/5ML IJ SOLN
INTRAMUSCULAR | Status: AC
  Filled 2018-10-13: qty 5

## 2018-10-13 MED ORDER — HYDROMORPHONE HCL 2 MG/ML IJ SOLN
INTRAMUSCULAR | Status: AC
  Filled 2018-10-13: qty 1

## 2018-10-13 MED ORDER — DARIFENACIN HYDROBROMIDE ER 7.5 MG PO TB24
7.5000 mg | ORAL_TABLET | Freq: Every day | ORAL | Status: DC
  Administered 2018-10-13 – 2018-10-16 (×4): 7.5 mg via ORAL
  Filled 2018-10-13 (×4): qty 1

## 2018-10-13 MED ORDER — PHENYLEPHRINE 40 MCG/ML (10ML) SYRINGE FOR IV PUSH (FOR BLOOD PRESSURE SUPPORT)
PREFILLED_SYRINGE | INTRAVENOUS | Status: DC | PRN
  Administered 2018-10-13: 80 ug via INTRAVENOUS

## 2018-10-13 MED ORDER — KETOROLAC TROMETHAMINE 30 MG/ML IJ SOLN
INTRAMUSCULAR | Status: AC
  Filled 2018-10-13: qty 1

## 2018-10-13 MED ORDER — SODIUM CHLORIDE (PF) 0.9 % IJ SOLN
INTRAMUSCULAR | Status: DC | PRN
  Administered 2018-10-13: 20 mL

## 2018-10-13 MED ORDER — ONDANSETRON HCL 4 MG/2ML IJ SOLN
INTRAMUSCULAR | Status: DC | PRN
  Administered 2018-10-13: 4 mg via INTRAVENOUS

## 2018-10-13 MED ORDER — DIPHENHYDRAMINE HCL 12.5 MG/5ML PO ELIX: 12.5000 mg | ORAL_SOLUTION | Freq: Four times a day (QID) | ORAL | Status: DC | PRN

## 2018-10-13 MED ORDER — LACTATED RINGERS IV SOLN
INTRAVENOUS | Status: DC
Start: 2018-10-13 — End: 2018-10-13
  Administered 2018-10-13 (×2): via INTRAVENOUS

## 2018-10-13 MED ORDER — HYDROMORPHONE HCL 1 MG/ML IJ SOLN
0.2500 mg | INTRAMUSCULAR | Status: DC | PRN
  Administered 2018-10-13: 0.5 mg via INTRAVENOUS

## 2018-10-13 MED ORDER — HYDROCODONE-ACETAMINOPHEN 5-325 MG PO TABS
1.0000 | ORAL_TABLET | ORAL | Status: DC | PRN
  Administered 2018-10-13 (×2): 1 via ORAL
  Administered 2018-10-14: 2 via ORAL
  Administered 2018-10-14: 1 via ORAL
  Filled 2018-10-13: qty 2
  Filled 2018-10-13 (×3): qty 1

## 2018-10-13 MED ORDER — PROMETHAZINE HCL 25 MG/ML IJ SOLN: 6.2500 mg | INTRAMUSCULAR | Status: DC | PRN

## 2018-10-13 MED ORDER — FENTANYL CITRATE (PF) 100 MCG/2ML IJ SOLN
INTRAMUSCULAR | Status: DC | PRN
  Administered 2018-10-13: 50 ug via INTRAVENOUS
  Administered 2018-10-13: 100 ug via INTRAVENOUS
  Administered 2018-10-13 (×2): 50 ug via INTRAVENOUS

## 2018-10-13 MED ORDER — MIDAZOLAM HCL 2 MG/2ML IJ SOLN
INTRAMUSCULAR | Status: AC
  Filled 2018-10-13: qty 2

## 2018-10-13 MED ORDER — ONDANSETRON HCL 4 MG/2ML IJ SOLN
INTRAMUSCULAR | Status: AC
  Filled 2018-10-13: qty 2

## 2018-10-13 MED ORDER — PROPOFOL 10 MG/ML IV BOLUS
INTRAVENOUS | Status: DC | PRN
  Administered 2018-10-13: 150 mg via INTRAVENOUS

## 2018-10-13 MED ORDER — SODIUM CHLORIDE (PF) 0.9 % IJ SOLN
INTRAMUSCULAR | Status: AC
  Filled 2018-10-13: qty 20

## 2018-10-13 MED ORDER — DIPHENHYDRAMINE HCL 50 MG/ML IJ SOLN: 12.5000 mg | Freq: Four times a day (QID) | INTRAMUSCULAR | Status: DC | PRN

## 2018-10-13 MED ORDER — SUGAMMADEX SODIUM 200 MG/2ML IV SOLN
INTRAVENOUS | Status: DC | PRN
  Administered 2018-10-13: 140 mg via INTRAVENOUS

## 2018-10-13 MED ORDER — SIMVASTATIN 40 MG PO TABS
40.0000 mg | ORAL_TABLET | Freq: Every evening | ORAL | Status: DC
  Administered 2018-10-13 – 2018-10-15 (×3): 40 mg via ORAL
  Filled 2018-10-13 (×3): qty 1

## 2018-10-13 MED ORDER — ROCURONIUM BROMIDE 10 MG/ML (PF) SYRINGE
PREFILLED_SYRINGE | INTRAVENOUS | Status: DC | PRN
  Administered 2018-10-13: 10 mg via INTRAVENOUS
  Administered 2018-10-13: 50 mg via INTRAVENOUS
  Administered 2018-10-13 (×3): 10 mg via INTRAVENOUS

## 2018-10-13 MED ORDER — BUPIVACAINE LIPOSOME 1.3 % IJ SUSP
20.0000 mL | Freq: Once | INTRAMUSCULAR | Status: AC
  Administered 2018-10-13: 20 mL
  Filled 2018-10-13: qty 20

## 2018-10-13 MED ORDER — GENTAMICIN SULFATE 40 MG/ML IJ SOLN
340.0000 mg | INTRAVENOUS | Status: AC
  Administered 2018-10-13: 340 mg via INTRAVENOUS
  Filled 2018-10-13: qty 8.5

## 2018-10-13 MED ORDER — DEXTROSE-NACL 5-0.45 % IV SOLN
INTRAVENOUS | Status: DC
  Administered 2018-10-13 – 2018-10-16 (×6): via INTRAVENOUS

## 2018-10-13 MED ORDER — MEPERIDINE HCL 50 MG/ML IJ SOLN: 6.2500 mg | INTRAMUSCULAR | Status: DC | PRN

## 2018-10-13 MED ORDER — HYDROMORPHONE HCL 1 MG/ML IJ SOLN
INTRAMUSCULAR | Status: DC | PRN
  Administered 2018-10-13: 0.5 mg via INTRAVENOUS

## 2018-10-13 MED ORDER — ACETAMINOPHEN 325 MG PO TABS
650.0000 mg | ORAL_TABLET | ORAL | Status: DC | PRN
  Administered 2018-10-15 (×2): 650 mg via ORAL
  Filled 2018-10-13 (×3): qty 2

## 2018-10-13 MED ORDER — MIDAZOLAM HCL 5 MG/5ML IJ SOLN
INTRAMUSCULAR | Status: DC | PRN
  Administered 2018-10-13: 2 mg via INTRAVENOUS

## 2018-10-13 MED ORDER — DEXAMETHASONE SODIUM PHOSPHATE 10 MG/ML IJ SOLN
INTRAMUSCULAR | Status: AC
  Filled 2018-10-13: qty 1

## 2018-10-13 MED ORDER — HYDROMORPHONE HCL 1 MG/ML IJ SOLN
0.2500 mg | INTRAMUSCULAR | Status: DC | PRN
  Administered 2018-10-13 (×4): 0.5 mg via INTRAVENOUS

## 2018-10-13 MED ORDER — DEXAMETHASONE SODIUM PHOSPHATE 10 MG/ML IJ SOLN
INTRAMUSCULAR | Status: DC | PRN
  Administered 2018-10-13: 10 mg via INTRAVENOUS

## 2018-10-13 MED ORDER — ONDANSETRON HCL 4 MG/2ML IJ SOLN: 4.0000 mg | INTRAMUSCULAR | Status: DC | PRN

## 2018-10-13 MED ORDER — LIDOCAINE 2% (20 MG/ML) 5 ML SYRINGE
INTRAMUSCULAR | Status: DC | PRN
  Administered 2018-10-13: 50 mg via INTRAVENOUS

## 2018-10-13 MED ORDER — LIDOCAINE 2% (20 MG/ML) 5 ML SYRINGE
INTRAMUSCULAR | Status: AC
  Filled 2018-10-13: qty 5

## 2018-10-13 MED ORDER — PROPOFOL 10 MG/ML IV BOLUS
INTRAVENOUS | Status: AC
  Filled 2018-10-13: qty 20

## 2018-10-13 MED ORDER — HYDROMORPHONE HCL 1 MG/ML IJ SOLN
INTRAMUSCULAR | Status: AC
  Administered 2018-10-13: 13:00:00
  Filled 2018-10-13: qty 1

## 2018-10-13 SURGICAL SUPPLY — 58 items
ADH SKN CLS APL DERMABOND .7 (GAUZE/BANDAGES/DRESSINGS) ×1
APL PRP STRL LF DISP 70% ISPRP (MISCELLANEOUS) ×1
BAG LAPAROSCOPIC 12 15 PORT 16 (BASKET) ×1 IMPLANT
BAG RETRIEVAL 12/15 (BASKET) ×2
BAG RETRIEVAL 12/15MM (BASKET) ×1
CHLORAPREP W/TINT 26 (MISCELLANEOUS) ×3 IMPLANT
CLIP VESOLOCK LG 6/CT PURPLE (CLIP) ×3 IMPLANT
CLIP VESOLOCK MED LG 6/CT (CLIP) ×3 IMPLANT
CLIP VESOLOCK XL 6/CT (CLIP) ×3 IMPLANT
COVER SURGICAL LIGHT HANDLE (MISCELLANEOUS) ×3 IMPLANT
COVER TIP SHEARS 8 DVNC (MISCELLANEOUS) ×1 IMPLANT
COVER TIP SHEARS 8MM DA VINCI (MISCELLANEOUS) ×2
COVER WAND RF STERILE (DRAPES) IMPLANT
CUTTER ECHEON FLEX ENDO 45 340 (ENDOMECHANICALS) IMPLANT
DECANTER SPIKE VIAL GLASS SM (MISCELLANEOUS) ×3 IMPLANT
DERMABOND ADVANCED (GAUZE/BANDAGES/DRESSINGS) ×2
DERMABOND ADVANCED .7 DNX12 (GAUZE/BANDAGES/DRESSINGS) ×1 IMPLANT
DRAPE ARM DVNC X/XI (DISPOSABLE) ×4 IMPLANT
DRAPE COLUMN DVNC XI (DISPOSABLE) ×1 IMPLANT
DRAPE DA VINCI XI ARM (DISPOSABLE) ×8
DRAPE DA VINCI XI COLUMN (DISPOSABLE) ×2
DRAPE INCISE IOBAN 66X45 STRL (DRAPES) ×3 IMPLANT
DRAPE SHEET LG 3/4 BI-LAMINATE (DRAPES) ×3 IMPLANT
ELECT PENCIL ROCKER SW 15FT (MISCELLANEOUS) ×3 IMPLANT
ELECT REM PT RETURN 15FT ADLT (MISCELLANEOUS) ×6 IMPLANT
GLOVE BIO SURGEON STRL SZ 6.5 (GLOVE) ×2 IMPLANT
GLOVE BIO SURGEON STRL SZ8 (GLOVE) ×6 IMPLANT
GLOVE BIO SURGEONS STRL SZ 6.5 (GLOVE) ×1
GLOVE BIOGEL PI IND STRL 8 (GLOVE) ×1 IMPLANT
GLOVE BIOGEL PI INDICATOR 8 (GLOVE) ×2
GOWN STRL REUS W/TWL LRG LVL3 (GOWN DISPOSABLE) ×9 IMPLANT
HEMOSTAT SURGICEL 4X8 (HEMOSTASIS) ×2 IMPLANT
IRRIG SUCT STRYKERFLOW 2 WTIP (MISCELLANEOUS) ×3
IRRIGATION SUCT STRKRFLW 2 WTP (MISCELLANEOUS) ×1 IMPLANT
KIT BASIN OR (CUSTOM PROCEDURE TRAY) ×3 IMPLANT
KIT TURNOVER KIT A (KITS) IMPLANT
LOOP VESSEL MAXI BLUE (MISCELLANEOUS) IMPLANT
NDL INSUFFLATION 14GA 120MM (NEEDLE) ×1 IMPLANT
NEEDLE INSUFFLATION 14GA 120MM (NEEDLE) ×3 IMPLANT
PROTECTOR NERVE ULNAR (MISCELLANEOUS) ×6 IMPLANT
RELOAD STAPLE 45 2.6 WHT THIN (STAPLE) IMPLANT
SEAL CANN UNIV 5-8 DVNC XI (MISCELLANEOUS) ×4 IMPLANT
SEAL XI 5MM-8MM UNIVERSAL (MISCELLANEOUS) ×8
SOLUTION ELECTROLUBE (MISCELLANEOUS) ×3 IMPLANT
SPONGE LAP 4X18 RFD (DISPOSABLE) IMPLANT
STAPLE RELOAD 45 WHT (STAPLE) ×2 IMPLANT
STAPLE RELOAD 45MM WHITE (STAPLE) ×6
SUT ETHILON 3 0 PS 1 (SUTURE) IMPLANT
SUT MNCRL AB 4-0 PS2 18 (SUTURE) ×6 IMPLANT
SUT PDS AB 1 TP1 96 (SUTURE) ×3 IMPLANT
SUT VICRYL 0 UR6 27IN ABS (SUTURE) ×6 IMPLANT
TAPE CLOTH 4X10 WHT NS (GAUZE/BANDAGES/DRESSINGS) ×3 IMPLANT
TOWEL OR NON WOVEN STRL DISP B (DISPOSABLE) ×3 IMPLANT
TRAY FOLEY MTR SLVR 16FR STAT (SET/KITS/TRAYS/PACK) IMPLANT
TRAY LAPAROSCOPIC (CUSTOM PROCEDURE TRAY) ×3 IMPLANT
TROCAR BLADELESS OPT 5 100 (ENDOMECHANICALS) ×3 IMPLANT
TROCAR XCEL 12X100 BLDLESS (ENDOMECHANICALS) ×3 IMPLANT
WATER STERILE IRR 1000ML POUR (IV SOLUTION) ×6 IMPLANT

## 2018-10-13 NOTE — Discharge Instructions (Signed)

## 2018-10-13 NOTE — Anesthesia Postprocedure Evaluation (Signed)
Anesthesia Post Note  Patient: Philip Stark  Procedure(s) Performed: XI ROBOTIC ASSISTED LAPAROSCOPIC NEPHRECTOMY (Right )     Patient location during evaluation: PACU Anesthesia Type: General Level of consciousness: sedated and oriented Pain management: pain level controlled Vital Signs Assessment: post-procedure vital signs reviewed and stable Respiratory status: spontaneous breathing Cardiovascular status: stable Postop Assessment: no apparent nausea or vomiting Anesthetic complications: no    Last Vitals:  Vitals:   10/13/18 1045 10/13/18 1100  BP: (!) 151/87 134/79  Pulse: 67 65  Resp: (!) 29 15  Temp:    SpO2: 95% 98%    Last Pain:  Vitals:   10/13/18 1100  TempSrc:   PainSc: 10-Worst pain ever   Pain Goal:                   Caren Macadam

## 2018-10-13 NOTE — Transfer of Care (Signed)
Immediate Anesthesia Transfer of Care Note  Patient: Philip Stark  Procedure(s) Performed: Procedure(s) with comments: XI ROBOTIC ASSISTED LAPAROSCOPIC NEPHRECTOMY (Right) - 2.5 HRS  Patient Location: PACU  Anesthesia Type:General  Level of Consciousness:  sedated, patient cooperative and responds to stimulation  Airway & Oxygen Therapy:Patient Spontanous Breathing and Patient connected to face mask oxgen  Post-op Assessment:  Report given to PACU RN and Post -op Vital signs reviewed and stable  Post vital signs:  Reviewed and stable  Last Vitals:  Vitals:   10/13/18 0541  BP: 128/78  Pulse: (!) 57  Resp: 16  Temp: 36.4 C  SpO2: 100%    Complications: No apparent anesthesia complications

## 2018-10-13 NOTE — Progress Notes (Signed)
Ambulated with patient full length of 4E and back; patient used front-roll walker.  No signs of distress.  At conclusion, patient stated, "I feel better."  RN checked oxygen after ambulation: 93%.  Removed patient from oxygen.

## 2018-10-13 NOTE — Anesthesia Procedure Notes (Signed)
Procedure Name: Intubation Date/Time: 10/13/2018 7:53 AM Performed by: Anne Fu, CRNA Pre-anesthesia Checklist: Patient identified, Emergency Drugs available, Suction available, Patient being monitored and Timeout performed Patient Re-evaluated:Patient Re-evaluated prior to induction Oxygen Delivery Method: Circle system utilized Preoxygenation: Pre-oxygenation with 100% oxygen Induction Type: IV induction Ventilation: Mask ventilation without difficulty Laryngoscope Size: Mac and 4 Grade View: Grade I Tube type: Oral Tube size: 7.5 mm Number of attempts: 1 Airway Equipment and Method: Stylet Placement Confirmation: ETT inserted through vocal cords under direct vision,  positive ETCO2 and breath sounds checked- equal and bilateral Secured at: 21 cm Tube secured with: Tape Dental Injury: Teeth and Oropharynx as per pre-operative assessment

## 2018-10-13 NOTE — H&P (Signed)
Urology Admission H&P  Chief Complaint: right flank pain  History of Present Illness: Mr Hammer is a 53yo with a hx of a poorly functioning right kidney and right flank pain. 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 He has constant dull moderate nonradiaitng right flank pain. No fevers. He has a foley and for the past 2 months he has noted worsening sediment. No hematuria  Past Medical History:  Diagnosis Date  . Cochlear implant in place   . Cystitis   . Foley catheter in place    french 14 per patient mother   . Hearing impaired    s/p cochlear implant,  pt can read lips  . Hyperlipidemia   . Learning disabilities   . Xanthogranulomatous pyelonephritis    right   Past Surgical History:  Procedure Laterality Date  . cardiac cath      unsure when but had it done at [facility] per patients mother ; reports unsure of the results but " he never had to go back for anything else "   . COCHLEAR IMPLANT Bilateral   . HYDROCELE EXCISION      Home Medications:  Current Facility-Administered Medications  Medication Dose Route Frequency Provider Last Rate Last Dose  . bupivacaine liposome (EXPAREL) 1.3 % injection 266 mg  20 mL Infiltration Once Malen Gauze, MD      . gentamicin (GARAMYCIN) 340 mg in dextrose 5 % 100 mL IVPB  340 mg Intravenous 30 min Pre-Op Ronne Binning Mardene Celeste, MD      . lactated ringers infusion   Intravenous Continuous Leilani Able, MD 20 mL/hr at 10/13/18 0600     Allergies:  Allergies  Allergen Reactions  . Penicillins Rash    Has patient had a PCN reaction causing immediate rash, facial/tongue/throat swelling, SOB or lightheadedness with hypotension: Yes Has patient had a PCN reaction causing severe rash involving mucus membranes or skin necrosis: No Has patient had a PCN reaction that required hospitalization Yes Has patient had a PCN reaction occurring within the last 10 years: Yes If all of the  above answers are "NO", then may proceed with Cephalosporin use.   . Ciprofloxacin Rash  . Clindamycin/Lincomycin Rash  . Latex Rash  . Silicone Rash  . Sulfa Antibiotics Rash  . Vancomycin Hives and Rash    History reviewed. No pertinent family history. Social History:  reports that he has never smoked. He has never used smokeless tobacco. He reports current alcohol use. He reports that he does not use drugs.  Review of Systems  Gastrointestinal: Positive for abdominal pain.  Genitourinary: Positive for flank pain.  All other systems reviewed and are negative.   Physical Exam:  Vital signs in last 24 hours: Temp:  [97.6 F (36.4 C)] 97.6 F (36.4 C) (03/30 0541) Pulse Rate:  [57] 57 (03/30 0541) Resp:  [16] 16 (03/30 0541) BP: (128)/(78) 128/78 (03/30 0541) SpO2:  [100 %] 100 % (03/30 0541) Weight:  [68.9 kg] 68.9 kg (03/30 0550) Physical Exam  Constitutional: He is oriented to person, place, and time. He appears well-developed and well-nourished.  HENT:  Head: Normocephalic and atraumatic.  Eyes: Pupils are equal, round, and reactive to light. EOM are normal.  Neck: Normal range of motion. No thyromegaly present.  Cardiovascular: Normal rate and regular rhythm.  Respiratory: Effort normal. No respiratory distress.  GI: Soft. He exhibits no distension.  Musculoskeletal: Normal range of motion.  General: No edema.  Neurological: He is alert and oriented to person, place, and time.  Skin: Skin is warm and dry.  Psychiatric: He has a normal mood and affect. His behavior is normal. Judgment and thought content normal.    Laboratory Data:  No results found for this or any previous visit (from the past 24 hour(s)). No results found for this or any previous visit (from the past 240 hour(s)). Creatinine: Recent Labs    10/07/18 1014  CREATININE 0.90   Baseline Creatinine: 0.9  Impression/Assessment:  53yo with a right XGP kidney  Plan:  The  risks/benefits/alternatives to robot assisted laparoscopic right simple nephrectomy was explained to the patient and his mother and they understand and wish to proceed with surgery  Wilkie Aye 10/13/2018, 7:32 AM

## 2018-10-13 NOTE — Op Note (Signed)
Preoperative diagnosis: Right chronically infected nonfunctioning kidney  Postop diagnosis: Same  Procedure: 1.  right robot assisted laparoscopic simple nephroureterectomy  Attending: Wilkie Aye  Assistant: Harrie Foreman, PA  Resident: Kevan Rosebush, MD  Anesthesia: General  Estimated blood loss: 50 cc  Drains: 16 French Foley catheter  Specimens: right nephrectomy and ureter   Antibiotics: ancef  Findings: tortuous ureter to the iliac vessels. 3 renal arteries, 1 renal vein  Indications: Patient is a 53 year old with a history of a chronically infected right kidney.  After discussing treatment options patient and mother decided to proceed with right robot assisted laparoscopic simple nephrectomy.  Procedure in detail: Prior to procedure consent was obtained. Patient was brought to the operating room and briefing was done sure correct patient, correct procedure, correct site.  General anesthesia was in administered patient was placed in the left lateral decubitus position.  a 16 French catheter was placed. their abdomen and flank was then prepped and draped usual sterile fashion.  A Veress needle was used to obtain pneumoperitoneum.  Once pneumoperitoneum was reestablished to 15 mmHg we then placed a 8 mm camera port lateral to the umbilicus at the lateral edge of rectus.  We then proceeded to place 3 more robotic ports. We then placed an assistant port inferior to the camera in the midline. We then placed a 47mm port for the liver retractor in the midline above the assistant port. We then docked the robot.   We then dissected along the white line of Toldt.  We then reflected the colon medially.  We then proceeded to kocherize the duodenum. We then identified the psoas muscle.  Once this was done we traced it down to the iliac vessels and identified the ureter.  We ligated the ureter at the level of the iliac vessels. Once we identified the gonadal vein and ureter were then traced  this to the renal hilum.  The renal vein and renal arterie were skeletonized.  We did we identified one renal vein and 3 renal arteries.  Using hem-o-lock clips we ligated the renal arteries.  Once this was done we then used a  Ethicon stapler to ligate the renal vein.  We then used a hem-o-lock clips to ligate the gonadal vein and the ureter.  Once this was done we then freed the kidney from its lateral and posterior attachments.  We then used a Endo Catch bag to remove the specimen.  Once the specimen was in the Endo Catch bag we then inspected the retroperitoneum and noted no residual bleeding.  We then removed our instruments, undocked the robot, and released the pneumoperitoneum.  We then made a right lower quardant incision to remove the specimen.  Once the specimen was removed we then closed the camera and assistant ports with 0 Vicryl in interrupted fashion.  We then closed the  Right lower quadrant incision with 0 PDS in a running fashion.  We then closed the overlying skin with 2-0 Vicryl in running fashion.  The skin was then closed with staples.  This then concluded the procedure which was well tolerated by the patient.  Complications: None  Condition: Stable, x-rayed, transferred to PACU.  Plan: Patient is to be admitted for inpatient stay. The foley catheter will be removed in the morning. They will be started on a clear liquid diet POD#1

## 2018-10-13 NOTE — Progress Notes (Signed)
Patient came with bilateral cochlear implants, glasses, and picture. Implants were places on ears and all belonging were with placed when transferred to room 1420.

## 2018-10-14 ENCOUNTER — Encounter (HOSPITAL_COMMUNITY): Payer: Self-pay | Admitting: Urology

## 2018-10-14 LAB — BASIC METABOLIC PANEL
Anion gap: 7 (ref 5–15)
BUN: 13 mg/dL (ref 6–20)
CO2: 23 mmol/L (ref 22–32)
Calcium: 8.6 mg/dL — ABNORMAL LOW (ref 8.9–10.3)
Chloride: 108 mmol/L (ref 98–111)
Creatinine, Ser: 1.14 mg/dL (ref 0.61–1.24)
GFR calc Af Amer: >60 mL/min (ref >60)
GFR calc non Af Amer: >60 mL/min (ref >60)
GLUCOSE: 131 mg/dL — AB (ref 70–99)
Potassium: 4.1 mmol/L (ref 3.5–5.1)
Sodium: 138 mmol/L (ref 135–145)

## 2018-10-14 LAB — HEMOGLOBIN AND HEMATOCRIT, BLOOD
HCT: 44.9 % (ref 39.0–52.0)
Hemoglobin: 14.9 g/dL (ref 13.0–17.0)

## 2018-10-14 MED ORDER — KETOROLAC TROMETHAMINE 15 MG/ML IJ SOLN
15.0000 mg | Freq: Once | INTRAMUSCULAR | Status: AC
  Administered 2018-10-14: 15 mg via INTRAVENOUS
  Filled 2018-10-14: qty 1

## 2018-10-14 MED ORDER — OXYCODONE-ACETAMINOPHEN 5-325 MG PO TABS
1.0000 | ORAL_TABLET | ORAL | Status: DC | PRN
  Administered 2018-10-14 (×3): 1 via ORAL
  Filled 2018-10-14 (×4): qty 1

## 2018-10-14 NOTE — Progress Notes (Signed)
1 Day Post-Op Subjective: Patient reports poor pain control. Patient not hungry. Labs normal  Objective: Vital signs in last 24 hours: Temp:  [98.8 F (37.1 C)-99.5 F (37.5 C)] 99.3 F (37.4 C) (03/31 2217) Pulse Rate:  [66-70] 68 (03/31 2217) Resp:  [16] 16 (03/31 2217) BP: (114-133)/(73-80) 133/80 (03/31 2217) SpO2:  [95 %-98 %] 96 % (03/31 2217)  Intake/Output from previous day: 03/30 0701 - 03/31 0700 In: 1601.2 [I.V.:1501.2; IV Piggyback:100] Out: 3150 [Urine:3050; Blood:100] Intake/Output this shift: Total I/O In: -  Out: 575 [Urine:575]  Physical Exam:  General:alert, cooperative and appears stated age GI: soft and tenderness: RUQ Male genitalia: not done Extremities: extremities normal, atraumatic, no cyanosis or edema  Lab Results: Recent Labs    10/13/18 1033 10/14/18 0416  HGB 15.6 14.9  HCT 47.1 44.9   BMET Recent Labs    10/14/18 0416  NA 138  K 4.1  CL 108  CO2 23  GLUCOSE 131*  BUN 13  CREATININE 1.14  CALCIUM 8.6*   No results for input(s): LABPT, INR in the last 72 hours. No results for input(s): LABURIN in the last 72 hours. Results for orders placed or performed during the hospital encounter of 03/18/17  Urine culture     Status: Abnormal   Collection Time: 03/18/17  7:20 PM  Result Value Ref Range Status   Specimen Description URINE, RANDOM  Final   Special Requests NONE  Final   Culture MULTIPLE SPECIES PRESENT, SUGGEST RECOLLECTION (A)  Final   Report Status 03/21/2017 FINAL  Final    Studies/Results: No results found.  Assessment/Plan: POD#1 right simple nephrectomy  1. toradol 15mg  once 2. Oxycodone and dilaudid prn 3. Clear liquid diet   LOS: 1 day   Wilkie Aye 10/14/2018, 11:10 PM

## 2018-10-14 NOTE — Progress Notes (Signed)
Patient c/o pain 10/10 at start of shift.  Dr. Ronne Binning rounding; aware.  New orders placed.  New pain medication administered to patient and he appears more comfortable at this time.  Pt also verbalizes that pain is improving. Will continue to monitor.

## 2018-10-15 MED ORDER — HYDROMORPHONE HCL 1 MG/ML IJ SOLN
1.0000 mg | INTRAMUSCULAR | Status: DC | PRN
  Administered 2018-10-15 – 2018-10-16 (×2): 1 mg via INTRAVENOUS
  Filled 2018-10-15 (×2): qty 1

## 2018-10-15 MED ORDER — OXYCODONE-ACETAMINOPHEN 5-325 MG PO TABS
1.0000 | ORAL_TABLET | ORAL | Status: DC | PRN
  Administered 2018-10-15 – 2018-10-16 (×3): 1 via ORAL
  Filled 2018-10-15 (×2): qty 1

## 2018-10-15 MED ORDER — KETOROLAC TROMETHAMINE 15 MG/ML IJ SOLN
15.0000 mg | Freq: Once | INTRAMUSCULAR | Status: AC
  Administered 2018-10-15: 15 mg via INTRAVENOUS
  Filled 2018-10-15: qty 1

## 2018-10-15 NOTE — Progress Notes (Signed)
Report received from A. Dicky, Charity fundraiser. No change from initial pm assessment. Will continue to monitor and follow the POC.

## 2018-10-15 NOTE — Progress Notes (Signed)
2 Days Post-Op Subjective: Patient reports better pain control. Patient tolerating liquids. No flatus  Objective: Vital signs in last 24 hours: Temp:  [97.9 F (36.6 C)-99.3 F (37.4 C)] 97.9 F (36.6 C) (04/01 0457) Pulse Rate:  [68-86] 86 (04/01 0457) Resp:  [16-20] 20 (04/01 0457) BP: (133-151)/(80-92) 151/92 (04/01 0457) SpO2:  [92 %-96 %] 92 % (04/01 0457)  Intake/Output from previous day: 03/31 0701 - 04/01 0700 In: 600 [P.O.:600] Out: 2125 [Urine:2125] Intake/Output this shift: No intake/output data recorded.  Physical Exam:  General:alert, cooperative and appears stated age GI: soft and tenderness: RUQ Male genitalia: not done Extremities: extremities normal, atraumatic, no cyanosis or edema  Lab Results: Recent Labs    10/13/18 1033 10/14/18 0416  HGB 15.6 14.9  HCT 47.1 44.9   BMET Recent Labs    10/14/18 0416  NA 138  K 4.1  CL 108  CO2 23  GLUCOSE 131*  BUN 13  CREATININE 1.14  CALCIUM 8.6*   No results for input(s): LABPT, INR in the last 72 hours. No results for input(s): LABURIN in the last 72 hours. Results for orders placed or performed during the hospital encounter of 03/18/17  Urine culture     Status: Abnormal   Collection Time: 03/18/17  7:20 PM  Result Value Ref Range Status   Specimen Description URINE, RANDOM  Final   Special Requests NONE  Final   Culture MULTIPLE SPECIES PRESENT, SUGGEST RECOLLECTION (A)  Final   Report Status 03/21/2017 FINAL  Final    Studies/Results: No results found.  Assessment/Plan: POD#2 right simple nephrectomy  1. toradol 15mg  once 2. Oxycodone and dilaudid prn 3. Full  liquid diet 4. Continue to ambulate in halls with assistance   LOS: 2 days   Wilkie Aye 10/15/2018, 9:44 AM

## 2018-10-16 NOTE — Progress Notes (Signed)
The mother of the patient called at 9:16 asking to come pick up her son as he should be discharged. This RN explained that the doctor had not been around yet today. She expressed that the previous Dr. Ronne Binning had been calling her shortly passed 8am each morning and she had not gotten a call so assumed she could come and pick up the patient. This RN stated that she would call with an update.   The patient was checked on by this RN and was taking a walk with the nurse tech.   The mother of the patient called again at 10:28 asking the same things as above. Again, this RN expressed that the doctor had not come in to see the patient yet. The plan for the day is not secured due to the doctor not being around yet.   The patient was checked on by this RN and he was sitting comfortably in the chair with no complaints. This RN asked if the patient was feeling okay and he stated yes. He did not express any needs when asked before leaving.  The mother called again at 11:51 and stated that she had called the urology office and that it was a different doctor on today. This RN stated that the doctor had still not been by but has other patients to see as well. Again, the mother did not like this answer. This RN explained that a doctors order is needed to discharge a patient from the hospital. The mother got verbally aggressive towards this RN. The conversation ended and this RN hung up. The mother then proceeds to call the patient and the nurse tech Durene Cal happened to be in the room. He answered the phone for the patient. He explained everything further that this RN had explained to the mother.   The patient was checked on and he was having a conversation with Durene Cal the nurse tech at the bedside.   PA was messaged by this RN around 1430 to get an update to be able to update the mother.  When checking on patient at 1445, patient is sleeping soundly in the bed.

## 2018-10-30 NOTE — Discharge Summary (Signed)
Physician Discharge Summary  Patient ID: Philip Stark MRN: [id number] DOB/AGE: 12/08/1965 53 y.o.  Admit date: 10/13/2018 Discharge date: 10/16/2018  Admission Diagnoses: XGP Discharge Diagnoses:  Active Problems:   XGP (xanthogranulomatous pyelonephritis)   Discharged Condition: good  Hospital Course: The patient tolerated the procedure well and was transferred to the floor on IV pain meds, IV fluid. On POD#1 pt was started on clear liquid diet and they ambulated in the halls. On POD#2 the patient was transitioned to a regular diet, IVFs were discontinued, and the patient passed flatus. Prior to discharge the pt was tolerating a regular diet, pain was controlled on PO pain meds, they were ambulating without difficulty, and they had normal bowel function.   Consults: None  Significant Diagnostic Studies: none  Treatments: surgery: right simple nephrectomy, robotic  Discharge Exam: Blood pressure (!) 133/92, pulse 88, temperature 98.5 F (36.9 C), temperature source Oral, resp. rate 13, height 6' (1.829 m), weight 71.4 kg, SpO2 100 %. General appearance: alert, cooperative and appears stated age Head: Normocephalic, without obvious abnormality, atraumatic Nose: Nares normal. Septum midline. Mucosa normal. No drainage or sinus tenderness. Back: symmetric, no curvature. ROM normal. No CVA tenderness. Cardio: regular rate and rhythm, S1, S2 normal, no murmur, click, rub or gallop GI: soft, non-tender; bowel sounds normal; no masses,  no organomegaly Extremities: extremities normal, atraumatic, no cyanosis or edema Neurologic: Grossly normal  Disposition:    Allergies as of 10/16/2018      Reactions   Penicillins Rash   Has patient had a PCN reaction causing immediate rash, facial/tongue/throat swelling, SOB or lightheadedness with hypotension: Yes Has patient had a PCN reaction causing severe rash involving mucus membranes or skin necrosis: No Has patient had a PCN reaction  that required hospitalization Yes Has patient had a PCN reaction occurring within the last 10 years: Yes If all of the above answers are "NO", then may proceed with Cephalosporin use.   Ciprofloxacin Rash   Clindamycin/lincomycin Rash   Latex Rash   Silicone Rash   Sulfa Antibiotics Rash   Vancomycin Hives, Rash      Medication List    TAKE these medications   HYDROcodone-acetaminophen 5-325 MG tablet Commonly known as:  Norco Take 1-2 tablets by mouth every 6 (six) hours as needed for moderate pain or severe pain.   simvastatin 40 MG tablet Commonly known as:  ZOCOR Take 40 mg by mouth every evening.   VESIcare 5 MG tablet Generic drug:  solifenacin Take 5 mg by mouth at bedtime.      Follow-up Information    Namari Breton, Mardene Celeste, MD On 10/20/2018.   Specialty:  Urology Why:  at 8:45 Contact information: [address] [place] [address] [phone]           Signed: Wilkie Aye 10/30/2018, 5:04 PM

## 2019-07-16 ENCOUNTER — Ambulatory Visit (INDEPENDENT_AMBULATORY_CARE_PROVIDER_SITE_OTHER): Payer: Medicare Other | Admitting: Podiatry

## 2019-07-16 ENCOUNTER — Encounter: Payer: Self-pay | Admitting: Podiatry

## 2019-07-16 ENCOUNTER — Other Ambulatory Visit: Payer: Self-pay | Admitting: Podiatry

## 2019-07-16 ENCOUNTER — Other Ambulatory Visit: Payer: Self-pay

## 2019-07-16 ENCOUNTER — Ambulatory Visit: Payer: Medicare Other

## 2019-07-16 DIAGNOSIS — M79671 Pain in right foot: Secondary | ICD-10-CM

## 2019-07-16 DIAGNOSIS — M79672 Pain in left foot: Secondary | ICD-10-CM

## 2019-07-16 DIAGNOSIS — S92501A Displaced unspecified fracture of right lesser toe(s), initial encounter for closed fracture: Secondary | ICD-10-CM

## 2019-11-06 ENCOUNTER — Emergency Department (HOSPITAL_COMMUNITY)
Admission: EM | Admit: 2019-11-06 | Discharge: 2019-11-06 | Disposition: A | Discharge status: Home / Self Care | Payer: Medicare Other | Attending: Emergency Medicine | Admitting: Emergency Medicine

## 2019-11-06 ENCOUNTER — Emergency Department (HOSPITAL_COMMUNITY): Payer: Medicare Other

## 2019-11-06 ENCOUNTER — Encounter (HOSPITAL_COMMUNITY): Payer: Self-pay | Admitting: *Deleted

## 2019-11-06 ENCOUNTER — Telehealth: Payer: Self-pay | Admitting: *Deleted

## 2019-11-06 DIAGNOSIS — N50811 Right testicular pain: Secondary | ICD-10-CM | POA: Diagnosis present

## 2019-11-06 DIAGNOSIS — N451 Epididymitis: Secondary | ICD-10-CM | POA: Diagnosis not present

## 2019-11-06 LAB — URINALYSIS, ROUTINE W REFLEX MICROSCOPIC
Bilirubin Urine: NEGATIVE
Glucose, UA: NEGATIVE mg/dL
Ketones, ur: 5 mg/dL — AB
Nitrite: POSITIVE — AB
Protein, ur: 30 mg/dL — AB
Specific Gravity, Urine: 1.013 (ref 1.005–1.030)
WBC, UA: >50 WBC/hpf — ABNORMAL HIGH (ref 0–5)
pH: 9 — ABNORMAL HIGH (ref 5.0–8.0)

## 2019-11-06 LAB — BASIC METABOLIC PANEL
Anion gap: 5 (ref 5–15)
BUN: 15 mg/dL (ref 6–20)
CO2: 20 mmol/L — ABNORMAL LOW (ref 22–32)
Calcium: 7 mg/dL — ABNORMAL LOW (ref 8.9–10.3)
Chloride: 116 mmol/L — ABNORMAL HIGH (ref 98–111)
Creatinine, Ser: 0.95 mg/dL (ref 0.61–1.24)
GFR calc Af Amer: >60 mL/min (ref >60)
GFR calc non Af Amer: >60 mL/min (ref >60)
Glucose, Bld: 110 mg/dL — ABNORMAL HIGH (ref 70–99)
Potassium: 3.2 mmol/L — ABNORMAL LOW (ref 3.5–5.1)
Sodium: 141 mmol/L (ref 135–145)

## 2019-11-06 LAB — CBC WITH DIFFERENTIAL/PLATELET
Abs Immature Granulocytes: 0.07 10*3/uL (ref 0.00–0.07)
Basophils Absolute: 0 10*3/uL (ref 0.0–0.1)
Basophils Relative: 0 %
Eosinophils Absolute: 0 10*3/uL (ref 0.0–0.5)
Eosinophils Relative: 0 %
HCT: 48.4 % (ref 39.0–52.0)
Hemoglobin: 16.6 g/dL (ref 13.0–17.0)
Immature Granulocytes: 0 %
Lymphocytes Relative: 3 %
Lymphs Abs: 0.6 10*3/uL — ABNORMAL LOW (ref 0.7–4.0)
MCH: 31.4 pg (ref 26.0–34.0)
MCHC: 34.3 g/dL (ref 30.0–36.0)
MCV: 91.7 fL (ref 80.0–100.0)
Monocytes Absolute: 1.1 10*3/uL — ABNORMAL HIGH (ref 0.1–1.0)
Monocytes Relative: 6 %
Neutro Abs: 16.5 10*3/uL — ABNORMAL HIGH (ref 1.7–7.7)
Neutrophils Relative %: 91 %
Platelets: 183 10*3/uL (ref 150–400)
RBC: 5.28 MIL/uL (ref 4.22–5.81)
RDW: 12.6 % (ref 11.5–15.5)
WBC: 18.4 10*3/uL — ABNORMAL HIGH (ref 4.0–10.5)
nRBC: 0 % (ref 0.0–0.2)

## 2019-11-06 MED ORDER — MORPHINE SULFATE (PF) 4 MG/ML IV SOLN
4.0000 mg | Freq: Once | INTRAVENOUS | Status: AC
  Administered 2019-11-06: 11:00:00 4 mg via INTRAVENOUS
  Filled 2019-11-06: qty 1

## 2019-11-06 MED ORDER — ONDANSETRON HCL 4 MG/2ML IJ SOLN
4.0000 mg | Freq: Once | INTRAMUSCULAR | Status: AC
  Administered 2019-11-06: 11:00:00 4 mg via INTRAVENOUS
  Filled 2019-11-06: qty 2

## 2019-11-06 MED ORDER — OXYCODONE-ACETAMINOPHEN 5-325 MG PO TABS: 1.0000 | ORAL_TABLET | Freq: Four times a day (QID) | ORAL | 0 refills | Status: DC | PRN

## 2019-11-06 MED ORDER — DOXYCYCLINE HYCLATE 100 MG PO CAPS: 100.0000 mg | ORAL_CAPSULE | Freq: Two times a day (BID) | ORAL | 0 refills | Status: DC

## 2019-11-06 NOTE — Discharge Instructions (Signed)
Begin taking doxycycline as prescribed.  Take Percocet as prescribed as needed for pain.  Follow-up with urology if your symptoms or not improving in the next few days.  The contact information for [facility] has been provided in this discharge summary for you to call and make these arrangements.

## 2019-11-06 NOTE — Telephone Encounter (Signed)
TOC CM received call from pt's mother states the pharmacy did not receive doxycycline. Clarified Rx with ED provider. Contacted pharmacy to call in the doxycycline per MD orders. Notified pt's mother with update. States she will pick up this evening. Isidoro Donning RN CCM, [facility] ED TOC CM [phone]

## 2019-11-06 NOTE — ED Triage Notes (Addendum)
Per EMS, pt says his "testicles are twisted" since last night, getting worse. Pt has hx of learning disability and cochlear implants.   Pt has foley catheter.   BP 128/68 HR 120 RR 20 O2 98%

## 2019-11-06 NOTE — ED Provider Notes (Signed)
[facility]-EMERGENCY DEPT Provider Note   CSN: [id number] Arrival date & time: 11/06/19  5176     History Chief Complaint  Patient presents with  . Testicle Pain    Philip Stark is a 54 y.o. male.  Patient is a 54 year old male with history of xanthogranulomatous pyelonephritis with prior nephrectomy, hearing impaired with cochlear implant.  Patient presents with complaints of testicle pain.  This began yesterday at approximately 5 PM and has gradually worsened throughout the night.  He describes swelling of his right testicle along with significant discomfort.  Patient has an indwelling Foley catheter and reports that this has been draining normally.  He denies any blood in his urine.  He denies any fevers or chills.  The history is provided by the patient.  Testicle Pain This is a new problem. Episode onset: 5 PM yesterday. The problem occurs constantly. The problem has been gradually worsening. Pertinent negatives include no abdominal pain. Nothing relieves the symptoms. He has tried nothing for the symptoms.       Past Medical History:  Diagnosis Date  . Cochlear implant in place   . Cystitis   . Foley catheter in place    french 14 per patient mother   . Hearing impaired    s/p cochlear implant,  pt can read lips  . Hyperlipidemia   . Learning disabilities   . Xanthogranulomatous pyelonephritis    right    Patient Active Problem List   Diagnosis Date Noted  . XGP (xanthogranulomatous pyelonephritis) 10/13/2018    Past Surgical History:  Procedure Laterality Date  . cardiac cath      unsure when but had it done at [facility] per patients mother ; reports unsure of the results but " he never had to go back for anything else "   . COCHLEAR IMPLANT Bilateral   . HYDROCELE EXCISION    . ROBOT ASSISTED LAPAROSCOPIC NEPHRECTOMY Right 10/13/2018   Procedure: XI ROBOTIC ASSISTED LAPAROSCOPIC NEPHRECTOMY;  Surgeon: Malen Gauze, MD;  Location:  [facility] ORS;  Service: Urology;  Laterality: Right;  2.5 HRS       No family history on file.  Social History   Tobacco Use  . Smoking status: Never Smoker  . Smokeless tobacco: Never Used  Substance Use Topics  . Alcohol use: Yes    Comment: occ  . Drug use: No    Home Medications Prior to Admission medications   Medication Sig Start Date End Date Taking? Authorizing Provider  HYDROcodone-acetaminophen (NORCO) 5-325 MG tablet Take 1-2 tablets by mouth every 6 (six) hours as needed for moderate pain or severe pain. 10/13/18   Harrie Foreman, PA-C  simvastatin (ZOCOR) 40 MG tablet Take 40 mg by mouth every evening.     [provider]  solifenacin (VESICARE) 5 MG tablet Take 5 mg by mouth at bedtime. 08/14/15   [provider]    Allergies    Penicillins, Ciprofloxacin, Clindamycin/lincomycin, Latex, Silicone, Sulfa antibiotics, and Vancomycin  Review of Systems   Review of Systems  Gastrointestinal: Negative for abdominal pain.  Genitourinary: Positive for testicular pain.  All other systems reviewed and are negative.   Physical Exam Updated Vital Signs BP (!) 143/90   Pulse (!) 103   Temp 99.4 F (37.4 C) (Oral)   Resp 18   SpO2 96%   Physical Exam Vitals and nursing note reviewed.  Constitutional:      General: He is not in acute distress.  Appearance: He is well-developed. He is not diaphoretic.  HENT:     Head: Normocephalic and atraumatic.  Cardiovascular:     Rate and Rhythm: Normal rate and regular rhythm.     Heart sounds: No murmur. No friction rub.  Pulmonary:     Effort: Pulmonary effort is normal. No respiratory distress.     Breath sounds: Normal breath sounds. No wheezing or rales.  Abdominal:     General: Bowel sounds are normal. There is no distension.     Palpations: Abdomen is soft.     Tenderness: There is no abdominal tenderness.  Genitourinary:    Comments: The right testicle appears swollen and is exquisitely tender.   There is no redness or erythema.  Complete exam difficult secondary to level of discomfort. Musculoskeletal:        General: Normal range of motion.     Cervical back: Normal range of motion and neck supple.  Skin:    General: Skin is warm and dry.  Neurological:     Mental Status: He is alert and oriented to person, place, and time.     Coordination: Coordination normal.     ED Results / Procedures / Treatments   Labs (all labs ordered are listed, but only abnormal results are displayed) Labs Reviewed  URINALYSIS, ROUTINE W REFLEX MICROSCOPIC  BASIC METABOLIC PANEL  CBC WITH DIFFERENTIAL/PLATELET    EKG None  Radiology No results found.  Procedures Procedures (including critical care time)  Medications Ordered in ED Medications  morphine 4 MG/ML injection 4 mg (has no administration in time range)  ondansetron (ZOFRAN) injection 4 mg (has no administration in time range)    ED Course  I have reviewed the triage vital signs and the nursing notes.  Pertinent labs & imaging results that were available during my care of the patient were reviewed by me and considered in my medical decision making (see chart for details).    MDM Rules/Calculators/A&P  Patient is a 54 year old male with history of indwelling Foley catheter presenting with complaints of right testicle pain and swelling since yesterday evening.  Patient's exam and ultrasound findings are most consistent with epididymitis.  Patient has multiple drug allergies, mostly to antibiotics, but doxycycline appears not on the list.  He will be treated with doxycycline for 2 weeks, prescribed medication for his pain, and is to follow-up with urology in the next few days if not improving.  Final Clinical Impression(s) / ED Diagnoses Final diagnoses:  None    Rx / DC Orders ED Discharge Orders    None       Veryl Speak, MD 11/06/19 1311

## 2019-11-25 ENCOUNTER — Ambulatory Visit: Payer: Medicare Other | Attending: Internal Medicine

## 2019-11-25 DIAGNOSIS — Z23 Encounter for immunization: Secondary | ICD-10-CM

## 2019-11-25 NOTE — Progress Notes (Signed)
   Covid-19 Vaccination Clinic  Name:  Philip Stark    MRN: [id number] DOB: 08/18/1965  11/25/2019  [name] was observed post Covid-19 immunization for 15 minutes without incident. He was provided with Vaccine Information Sheet and instruction to access the V-Safe system.   [name] was instructed to call 911 with any severe reactions post vaccine: Marland Kitchen Difficulty breathing  . Swelling of face and throat  . A fast heartbeat  . A bad rash all over body  . Dizziness and weakness   Immunizations Administered    Name Date Dose VIS Date Route   Pfizer COVID-19 Vaccine 11/25/2019 10:24 AM 0.3 mL 09/09/2018 Intramuscular   Manufacturer: [employer], [employer]   Lot: [id number]   NDC: [address]-9

## 2019-12-21 ENCOUNTER — Ambulatory Visit: Payer: Medicare Other | Attending: Internal Medicine

## 2019-12-21 DIAGNOSIS — Z23 Encounter for immunization: Secondary | ICD-10-CM

## 2019-12-21 NOTE — Progress Notes (Signed)
   Covid-19 Vaccination Clinic  Name:  Philip Stark    MRN: [id number] DOB: October 07, 1965  12/21/2019  Philip Stark was observed post Covid-19 immunization for 15 minutes without incident. He was provided with Vaccine Information Sheet and instruction to access the V-Safe system.   Philip Stark was instructed to call 911 with any severe reactions post vaccine: Marland Kitchen Difficulty breathing  . Swelling of face and throat  . A fast heartbeat  . A bad rash all over body  . Dizziness and weakness   Immunizations Administered    Name Date Dose VIS Date Route   Pfizer COVID-19 Vaccine 12/21/2019  9:59 AM 0.3 mL 09/09/2018 Intramuscular   Manufacturer: [employer], [employer]   Lot: [id number]   NDC: [address]-9

## 2020-06-30 ENCOUNTER — Emergency Department (HOSPITAL_COMMUNITY)
Admission: EM | Admit: 2020-06-30 | Discharge: 2020-06-30 | Disposition: A | Discharge status: Home / Self Care | Payer: Medicare Other | Attending: Emergency Medicine | Admitting: Emergency Medicine

## 2020-06-30 DIAGNOSIS — Z91040 Latex allergy status: Secondary | ICD-10-CM | POA: Diagnosis not present

## 2020-06-30 DIAGNOSIS — Y846 Urinary catheterization as the cause of abnormal reaction of the patient, or of later complication, without mention of misadventure at the time of the procedure: Secondary | ICD-10-CM | POA: Diagnosis not present

## 2020-06-30 DIAGNOSIS — T83091A Other mechanical complication of indwelling urethral catheter, initial encounter: Secondary | ICD-10-CM | POA: Diagnosis not present

## 2020-06-30 DIAGNOSIS — T839XXA Unspecified complication of genitourinary prosthetic device, implant and graft, initial encounter: Secondary | ICD-10-CM

## 2020-06-30 MED ORDER — CEFDINIR 300 MG PO CAPS: 300.0000 mg | ORAL_CAPSULE | Freq: Two times a day (BID) | ORAL | 0 refills | Status: DC

## 2020-06-30 MED ORDER — CEFDINIR 300 MG PO CAPS
300.0000 mg | ORAL_CAPSULE | Freq: Two times a day (BID) | ORAL | Status: DC
  Administered 2020-06-30: 300 mg via ORAL
  Filled 2020-06-30 (×2): qty 1

## 2020-06-30 NOTE — ED Notes (Signed)
Provider made aware of catheter stuck in pt's bladder. Urology to be called at this time.Pt is not in any pain, and bladder scan volume reads 0-62ml

## 2020-06-30 NOTE — ED Provider Notes (Signed)
[facility] DEPT Provider Note   CSN: [id number] Arrival date & time: 06/30/20  1626     History Chief Complaint  Patient presents with   Urinary Retention    Catheter issue    Philip Stark is a 54 y.o. male.  The history is provided by the patient, medical records and a parent.   Philip Stark is a 54 y.o. male who presents to the Emergency Department complaining of catheter problem. He has a history of chronic indwelling Foley catheter and normally changes these every two weeks. He attempted to change the catheter today and when he deflated the balloon he could not remove the catheter. He has attempted multiple times at home. He developed severe resistance in his urethra when attempting to remove it. He does report decreased urinary output today and darker urine with some cloudiness to his urine as well. He denies any fevers, nausea, vomiting, abdominal pain. No prior similar symptoms. History is provided by the patient and his mother.    Past Medical History:  Diagnosis Date   Cochlear implant in place    Cystitis    Foley catheter in place    french 14 per patient mother    Hearing impaired    s/p cochlear implant,  pt can read lips   Hyperlipidemia    Learning disabilities    Xanthogranulomatous pyelonephritis    right    Patient Active Problem List   Diagnosis Date Noted   XGP (xanthogranulomatous pyelonephritis) 10/13/2018    Past Surgical History:  Procedure Laterality Date   cardiac cath      unsure when but had it done at [facility] per patients mother ; reports unsure of the results but " he never had to go back for anything else "    COCHLEAR IMPLANT Bilateral    HYDROCELE EXCISION     ROBOT ASSISTED LAPAROSCOPIC NEPHRECTOMY Right 10/13/2018   Procedure: XI ROBOTIC [place];  Surgeon: Cleon Gustin, MD;  Location: [facility];  Service: Urology;  Laterality: Right;  2.5 HRS        No family history on file.  Social History   Tobacco Use   Smoking status: Never Smoker   Smokeless tobacco: Never Used  Substance Use Topics   Alcohol use: Yes    Comment: occ   Drug use: No    Home Medications Prior to Admission medications   Medication Sig Start Date End Date Taking? Authorizing Provider  doxycycline (VIBRAMYCIN) 100 MG capsule Take 1 capsule (100 mg total) by mouth 2 (two) times daily. One po bid x 7 days 11/06/19   Veryl Speak, MD  HYDROcodone-acetaminophen (NORCO) 5-325 MG tablet Take 1-2 tablets by mouth every 6 (six) hours as needed for moderate pain or severe pain. Patient not taking: Reported on 11/06/2019 10/13/18   Debbrah Alar, PA-C  oxyCODONE-acetaminophen (PERCOCET) 5-325 MG tablet Take 1-2 tablets by mouth every 6 (six) hours as needed. 11/06/19   Veryl Speak, MD  simvastatin (ZOCOR) 40 MG tablet Take 40 mg by mouth every evening.     [provider]  solifenacin (VESICARE) 5 MG tablet Take 5 mg by mouth at bedtime. 08/14/15   [provider]    Allergies    Penicillins, Ciprofloxacin, Clindamycin/lincomycin, Latex, Silicone, Sulfa antibiotics, and Vancomycin  Review of Systems   Review of Systems  All other systems reviewed and are negative.   Physical Exam Updated Vital Signs BP (!) 146/100    Pulse  83    Temp 98.4 F (36.9 C) (Oral)    Resp 18    SpO2 95%   Physical Exam Vitals and nursing note reviewed.  Constitutional:      Appearance: He is well-developed and well-nourished.  HENT:     Head: Normocephalic and atraumatic.  Cardiovascular:     Rate and Rhythm: Normal rate and regular rhythm.  Pulmonary:     Effort: Pulmonary effort is normal. No respiratory distress.  Abdominal:     Palpations: Abdomen is soft.     Tenderness: There is no abdominal tenderness. There is no guarding or rebound.  Genitourinary:    Penis: Normal.      Comments: 16 French Foley catheter in place. There is yellow urine  with thick white material mixed in. Musculoskeletal:        General: No tenderness or edema.  Skin:    General: Skin is warm and dry.  Neurological:     Mental Status: He is alert and oriented to person, place, and time.  Psychiatric:        Mood and Affect: Mood and affect normal.        Behavior: Behavior normal.     ED Results / Procedures / Treatments   Labs (all labs ordered are listed, but only abnormal results are displayed) Labs Reviewed - No data to display  EKG None  Radiology No results found.  Procedures Procedures (including critical care time)  Medications Ordered in ED Medications - No data to display  ED Course  I have reviewed the triage vital signs and the nursing notes.  Pertinent labs & imaging results that were available during my care of the patient were reviewed by me and considered in my medical decision making (see chart for details).    MDM Rules/Calculators/A&P                         patient here with inability to remove his Foley catheter in exchange for new catheter (routine home exchange) On attempt to remove catheter in the ED significant resistance is met after deflating the balloon. The catheter is draining without difficulty. Discussed with on-call urologist. Given that the catheter is functioning at this time will discharge home with outpatient follow-up in the urology office tomorrow. Will treat with antibiotics given patient has manipulated the catheter a lot today.  Final Clinical Impression(s) / ED Diagnoses Final diagnoses:  Foley catheter problem, initial encounter [facility])    Rx / DC Orders ED Discharge Orders    None       Quintella Reichert, MD 07/01/20 [phone]

## 2020-06-30 NOTE — ED Triage Notes (Signed)
Patient reports to the ER for catheter issue. Patient reports his catheter needed to be changed and when he went to change it it was stuck. Patient also reports blood in urine

## 2020-07-30 DIAGNOSIS — R339 Retention of urine, unspecified: Secondary | ICD-10-CM | POA: Diagnosis not present

## 2020-07-31 DIAGNOSIS — R339 Retention of urine, unspecified: Secondary | ICD-10-CM | POA: Diagnosis not present

## 2020-08-10 DIAGNOSIS — H0102B Squamous blepharitis left eye, upper and lower eyelids: Secondary | ICD-10-CM | POA: Diagnosis not present

## 2020-08-10 DIAGNOSIS — H0102A Squamous blepharitis right eye, upper and lower eyelids: Secondary | ICD-10-CM | POA: Diagnosis not present

## 2020-08-10 DIAGNOSIS — R51 Headache with orthostatic component, not elsewhere classified: Secondary | ICD-10-CM | POA: Diagnosis not present

## 2020-08-11 DIAGNOSIS — R339 Retention of urine, unspecified: Secondary | ICD-10-CM | POA: Diagnosis not present

## 2020-08-27 DIAGNOSIS — R339 Retention of urine, unspecified: Secondary | ICD-10-CM | POA: Diagnosis not present

## 2020-08-29 DIAGNOSIS — Z0000 Encounter for general adult medical examination without abnormal findings: Secondary | ICD-10-CM | POA: Diagnosis not present

## 2020-08-29 DIAGNOSIS — Z800 Family history of malignant neoplasm of digestive organs: Secondary | ICD-10-CM | POA: Diagnosis not present

## 2020-08-29 DIAGNOSIS — Z8371 Family history of colonic polyps: Secondary | ICD-10-CM | POA: Diagnosis not present

## 2020-08-29 DIAGNOSIS — H905 Unspecified sensorineural hearing loss: Secondary | ICD-10-CM | POA: Diagnosis not present

## 2020-08-29 DIAGNOSIS — F819 Developmental disorder of scholastic skills, unspecified: Secondary | ICD-10-CM | POA: Diagnosis not present

## 2020-08-29 DIAGNOSIS — E785 Hyperlipidemia, unspecified: Secondary | ICD-10-CM | POA: Diagnosis not present

## 2020-08-29 DIAGNOSIS — D649 Anemia, unspecified: Secondary | ICD-10-CM | POA: Diagnosis not present

## 2020-08-29 DIAGNOSIS — N3090 Cystitis, unspecified without hematuria: Secondary | ICD-10-CM | POA: Diagnosis not present

## 2020-08-29 DIAGNOSIS — Z1389 Encounter for screening for other disorder: Secondary | ICD-10-CM | POA: Diagnosis not present

## 2020-08-29 DIAGNOSIS — J309 Allergic rhinitis, unspecified: Secondary | ICD-10-CM | POA: Diagnosis not present

## 2020-08-29 DIAGNOSIS — N320 Bladder-neck obstruction: Secondary | ICD-10-CM | POA: Diagnosis not present

## 2020-08-31 DIAGNOSIS — R339 Retention of urine, unspecified: Secondary | ICD-10-CM | POA: Diagnosis not present

## 2020-09-01 DIAGNOSIS — R339 Retention of urine, unspecified: Secondary | ICD-10-CM | POA: Diagnosis not present

## 2020-09-06 DIAGNOSIS — N200 Calculus of kidney: Secondary | ICD-10-CM | POA: Diagnosis not present

## 2020-09-06 DIAGNOSIS — R8271 Bacteriuria: Secondary | ICD-10-CM | POA: Diagnosis not present

## 2020-09-26 DIAGNOSIS — R339 Retention of urine, unspecified: Secondary | ICD-10-CM | POA: Diagnosis not present

## 2020-09-27 DIAGNOSIS — R339 Retention of urine, unspecified: Secondary | ICD-10-CM | POA: Diagnosis not present

## 2020-10-06 DIAGNOSIS — H903 Sensorineural hearing loss, bilateral: Secondary | ICD-10-CM | POA: Diagnosis not present

## 2020-10-24 DIAGNOSIS — R339 Retention of urine, unspecified: Secondary | ICD-10-CM | POA: Diagnosis not present

## 2020-11-08 DIAGNOSIS — N200 Calculus of kidney: Secondary | ICD-10-CM | POA: Diagnosis not present

## 2020-11-09 DIAGNOSIS — N451 Epididymitis: Secondary | ICD-10-CM | POA: Diagnosis not present

## 2020-11-09 DIAGNOSIS — N390 Urinary tract infection, site not specified: Secondary | ICD-10-CM | POA: Diagnosis not present

## 2020-11-09 DIAGNOSIS — N200 Calculus of kidney: Secondary | ICD-10-CM | POA: Diagnosis not present

## 2020-11-18 DIAGNOSIS — R339 Retention of urine, unspecified: Secondary | ICD-10-CM | POA: Diagnosis not present

## 2020-11-20 ENCOUNTER — Emergency Department (HOSPITAL_COMMUNITY): Payer: Medicare Other

## 2020-11-20 ENCOUNTER — Emergency Department (HOSPITAL_COMMUNITY)
Admission: EM | Admit: 2020-11-20 | Discharge: 2020-11-20 | Disposition: A | Discharge status: Home / Self Care | Payer: Medicare Other | Attending: Emergency Medicine | Admitting: Emergency Medicine

## 2020-11-20 ENCOUNTER — Encounter (HOSPITAL_COMMUNITY): Payer: Self-pay

## 2020-11-20 DIAGNOSIS — R319 Hematuria, unspecified: Secondary | ICD-10-CM | POA: Diagnosis not present

## 2020-11-20 DIAGNOSIS — Z91040 Latex allergy status: Secondary | ICD-10-CM | POA: Diagnosis not present

## 2020-11-20 DIAGNOSIS — R109 Unspecified abdominal pain: Secondary | ICD-10-CM | POA: Diagnosis not present

## 2020-11-20 DIAGNOSIS — R1013 Epigastric pain: Secondary | ICD-10-CM | POA: Insufficient documentation

## 2020-11-20 DIAGNOSIS — Q600 Renal agenesis, unilateral: Secondary | ICD-10-CM | POA: Diagnosis not present

## 2020-11-20 DIAGNOSIS — N2889 Other specified disorders of kidney and ureter: Secondary | ICD-10-CM | POA: Diagnosis not present

## 2020-11-20 DIAGNOSIS — N200 Calculus of kidney: Secondary | ICD-10-CM | POA: Diagnosis not present

## 2020-11-20 LAB — URINALYSIS, ROUTINE W REFLEX MICROSCOPIC
Bilirubin Urine: NEGATIVE
Glucose, UA: NEGATIVE mg/dL
Ketones, ur: NEGATIVE mg/dL
Nitrite: POSITIVE — AB
Protein, ur: 30 mg/dL — AB
RBC / HPF: >50 RBC/hpf — ABNORMAL HIGH (ref 0–5)
Specific Gravity, Urine: 1.009 (ref 1.005–1.030)
WBC, UA: >50 WBC/hpf — ABNORMAL HIGH (ref 0–5)
pH: 8 (ref 5.0–8.0)

## 2020-11-20 LAB — CBC WITH DIFFERENTIAL/PLATELET
Abs Immature Granulocytes: 0.05 10*3/uL (ref 0.00–0.07)
Basophils Absolute: 0.1 10*3/uL (ref 0.0–0.1)
Basophils Relative: 1 %
Eosinophils Absolute: 0.1 10*3/uL (ref 0.0–0.5)
Eosinophils Relative: 1 %
HCT: 48.1 % (ref 39.0–52.0)
Hemoglobin: 16.4 g/dL (ref 13.0–17.0)
Immature Granulocytes: 1 %
Lymphocytes Relative: 13 %
Lymphs Abs: 1.4 10*3/uL (ref 0.7–4.0)
MCH: 30.8 pg (ref 26.0–34.0)
MCHC: 34.1 g/dL (ref 30.0–36.0)
MCV: 90.4 fL (ref 80.0–100.0)
Monocytes Absolute: 0.5 10*3/uL (ref 0.1–1.0)
Monocytes Relative: 5 %
Neutro Abs: 8.5 10*3/uL — ABNORMAL HIGH (ref 1.7–7.7)
Neutrophils Relative %: 79 %
Platelets: 253 10*3/uL (ref 150–400)
RBC: 5.32 MIL/uL (ref 4.22–5.81)
RDW: 12.6 % (ref 11.5–15.5)
WBC: 10.6 10*3/uL — ABNORMAL HIGH (ref 4.0–10.5)
nRBC: 0 % (ref 0.0–0.2)

## 2020-11-20 LAB — TROPONIN I (HIGH SENSITIVITY): Troponin I (High Sensitivity): 3 ng/L (ref <18)

## 2020-11-20 LAB — COMPREHENSIVE METABOLIC PANEL
ALT: 24 U/L (ref 0–44)
AST: 22 U/L (ref 15–41)
Albumin: 4 g/dL (ref 3.5–5.0)
Alkaline Phosphatase: 83 U/L (ref 38–126)
Anion gap: 7 (ref 5–15)
BUN: 16 mg/dL (ref 6–20)
CO2: 25 mmol/L (ref 22–32)
Calcium: 9.4 mg/dL (ref 8.9–10.3)
Chloride: 107 mmol/L (ref 98–111)
Creatinine, Ser: 1.28 mg/dL — ABNORMAL HIGH (ref 0.61–1.24)
GFR, Estimated: >60 mL/min (ref >60)
Glucose, Bld: 119 mg/dL — ABNORMAL HIGH (ref 70–99)
Potassium: 4.5 mmol/L (ref 3.5–5.1)
Sodium: 139 mmol/L (ref 135–145)
Total Bilirubin: 0.8 mg/dL (ref 0.3–1.2)
Total Protein: 7.5 g/dL (ref 6.5–8.1)

## 2020-11-20 LAB — LIPASE, BLOOD: Lipase: 29 U/L (ref 11–51)

## 2020-11-20 LAB — CK: Total CK: 46 U/L — ABNORMAL LOW (ref 49–397)

## 2020-11-20 NOTE — Discharge Instructions (Addendum)
Today we evaluated you for pain in the top of your abdomen and discolored urine. Please make sure you are drinking enough water.  That can cause your urine to appear darker. Please call your urologist tomorrow morning to set up a follow-up appointment, and let them know that you were in the emergency room.  Today after speaking with the on-call urologist urine cultures are sent.  If you do have evidence of a true infection you should receive a call regarding antibiotics.  I would recommend that you also follow-up on these results in MyChart.  If you have blood in your catheter bag I would recommend that you simply monitor it and document how much blood.   If you feel that your catheter is no longer emptying your bladder please first try replacing your catheter to see if that fixes the issue.  If that does not fix and you are still unable to drain your bladder please return to the emergency room.  Additionally any antibiotic can irritate your stomach.  Additionally Mobic or meloxicam can irritate your stomach.  I would recommend stopping the Mobic/meloxicam and not taking any other NSAID medications.  This includes ibuprofen, naproxen, Aleve, Advil, and Motrin.  You may take Tylenol if you normally tolerate that well as that is not considered to be rough on the stomach and is processed by the liver not the kidneys.  Additionally many of the antacid and stomach irritation medications can give you a increased risk of kidney stones including Tums, therefore I would recommend trying a bland diet first.  Please make sure you are drinking plenty of water.  This is especially important if you are having any blood in your urine to help increase the amount of urine you are making and dilute the blood.  If you develop chest pain, shortness of breath, fevers, are unable to drain your bladder even after a catheter change, or have any new or concerning symptoms please seek additional medical care and evaluation.

## 2020-11-20 NOTE — ED Notes (Addendum)
Patient brought back with male family member. Additional family member tried to join. Staff notified family only one visitor per patient at a time. Family became verbally aggressive to staff stating "you need to learn how to take care of deaf people." one male has surgical mask on stating she must talk to the patient because patient only reads her lips. This nurse educated visitor that she may take mask off in room to communicate with patient, only mandatory to wear mask in hallways to prevent visitor from potential contamination. Second male has on face shield only and states she must go in room also due to patient information being on her phone and the information may be needed. this nurse educated family member that one person would be able to assist patient in the room at this time. The additional male family member began yelling at staff "this place sucks. This is joe biden [place]". Patient roomed with first family member at bedside. This nurse explained visitor policy to patient and family, family member at bedside began yelling "I am not staying here four hours! How long will this take?" this nurse educated family the goal is to always get patients evaluated as quickly as possible. Family member states she is on the phone with another doctor and will talk to him about it. This nurse showed family member and patient where call ball was located and how to use should they need assistance. PATIENT then verbalized understanding of where call bell is and how to use. Patient is able to hear and understand directions given by this writer Hydrographic surveyor has mask on in room).Charge nurse notified and family was then notified that visitors may swap out, but only one at a time will be able to stay in room with patient in ED.

## 2020-11-20 NOTE — ED Provider Notes (Signed)
[facility]-EMERGENCY DEPT Provider Note   CSN: [id number] Arrival date & time: 11/20/20  1322     History Chief Complaint  Patient presents with  . Hematuria    Philip Stark is a 55 y.o. 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 1.  Hematuria: Patient reports that over the past 10 days he has had hematuria.  He was seen by his primary urologist, started on doxycycline and meloxicam.  He had had testicle pain at that time however this has since improved and resolved.  He states that until 10 days ago he was not having hematuria.  Patient's mother, who provides most of the history, is concerned that he may still have a urinary tract infection.  They are also concerned given that he has 1 kidney and that kidney has a known staghorn calculi if part of that has broken off and is causing symptoms including hematuria. Patient is chronically Foley dependent.  He states he last changed his Foley this morning.  He feels like his Foley is able to drain.  2. Epigastric pain: patient was brought in today because he also had half an hour of epigastric abdominal pain.  This started while he was sitting in a chair.  He has no history of similar.  He states the pain had resolved.  While he was having the pain he did feel short of breath, however his mother reports that he was "worked up" and this isn't abnormal for him.  No cardiac history.  He did not eat recently.  The pain did not radiate or move.    Patient had just eaten part of a hot/spicy sausage prior to the onset of his pain.    History is obtained from patient, mother, and chart review. Patient uses his cochlear implants and lipreading. With my mask on he was able to fully answer all questions without significant difficulty.      HPI     Past Medical History:  Diagnosis Date  . Cochlear implant in place   . Cystitis   . Foley catheter in place    french 14 per patient mother   . Hearing impaired    s/p cochlear implant,  pt can read lips  . Hyperlipidemia   . Learning disabilities   . Xanthogranulomatous pyelonephritis    right    Patient Active Problem List   Diagnosis Date Noted  . XGP (xanthogranulomatous pyelonephritis) 10/13/2018    Past Surgical History:  Procedure Laterality Date  . cardiac cath      unsure when but had it done at [facility] per patients mother ; reports unsure of the results but " he never had to go back for anything else "   . COCHLEAR IMPLANT Bilateral   . HYDROCELE EXCISION    . ROBOT ASSISTED LAPAROSCOPIC NEPHRECTOMY Right 10/13/2018   Procedure: XI ROBOTIC ASSISTED LAPAROSCOPIC NEPHRECTOMY;  Surgeon: Malen Gauze, MD;  Location: [facility]S;  Service: Urology;  Laterality: Right;  2.5 HRS       History reviewed. No pertinent family history.  Social History   Tobacco Use  . Smoking status: Never Smoker  . Smokeless tobacco: Never Used  Substance Use Topics  . Alcohol use: Yes    Comment: occ  . Drug use: No    Home Medications Prior to Admission medications  Medication Sig Start Date End Date Taking? Authorizing Provider  meloxicam (MOBIC) 15 MG tablet Take 15 mg by mouth daily. 11/09/20  Yes [provider]  montelukast (SINGULAIR) 10 MG tablet Take 10 mg by mouth daily as needed (allergy). 11/14/20  Yes [provider]  simvastatin (ZOCOR) 40 MG tablet Take 40 mg by mouth every evening.    Yes [provider]  solifenacin (VESICARE) 5 MG tablet Take 5 mg by mouth at bedtime. 08/14/15  Yes [provider]  cefdinir (OMNICEF) 300 MG capsule Take 1 capsule (300 mg total) by mouth 2 (two) times daily. Patient not taking: No sig reported 06/30/20   Tilden Fossaees, Stepheni Cameron, MD  doxycycline (VIBRAMYCIN) 100 MG capsule Take 1 capsule  (100 mg total) by mouth 2 (two) times daily. One po bid x 7 days 11/06/19   Geoffery Lyonselo, Douglas, MD  HYDROcodone-acetaminophen (NORCO) 5-325 MG tablet Take 1-2 tablets by mouth every 6 (six) hours as needed for moderate pain or severe pain. Patient not taking: No sig reported 10/13/18   Harrie Foremanancy, Amanda, PA-C  oxyCODONE-acetaminophen (PERCOCET) 5-325 MG tablet Take 1-2 tablets by mouth every 6 (six) hours as needed. Patient not taking: No sig reported 11/06/19   Geoffery Lyonselo, Douglas, MD    Allergies    Penicillins, Azithromycin, Ceftriaxone, Doxycycline, Phenazopyridine hcl, Ciprofloxacin, Clindamycin/lincomycin, Latex, Methocarbamol, Silicone, Sulfa antibiotics, and Vancomycin  Review of Systems   Review of Systems  Constitutional: Negative for chills and fever.  Respiratory: Negative for chest tightness.   Gastrointestinal: Positive for abdominal pain. Negative for constipation, diarrhea and vomiting.  Genitourinary: Positive for hematuria. Negative for penile pain and urgency.  Musculoskeletal: Negative for back pain and neck pain.  Neurological: Negative for weakness and headaches.  All other systems reviewed and are negative.   Physical Exam Updated Vital Signs BP (!) 129/93 (BP Location: Right Arm)   Pulse 62   Temp 98.5 F (36.9 C) (Oral)   Resp 14   SpO2 97%   Physical Exam Vitals and nursing note reviewed.  Constitutional:      General: He is not in acute distress.    Appearance: He is not diaphoretic.  HENT:     Head: Normocephalic and atraumatic.     Comments: CI present Eyes:     General: No scleral icterus.       Right eye: No discharge.        Left eye: No discharge.     Conjunctiva/sclera: Conjunctivae normal.  Cardiovascular:     Rate and Rhythm: Normal rate and regular rhythm.     Pulses: Normal pulses.  Pulmonary:     Effort: Pulmonary effort is normal. No respiratory distress.     Breath sounds: No stridor.  Abdominal:     General: There is no distension.      Palpations: Abdomen is soft.     Tenderness: There is abdominal tenderness (Mild, epigastric). There is no guarding or rebound.  Genitourinary:    Comments: Catheter leg bag was full, this was emptied.  There were no large clots in the bag, was 500 cc of dark yellow urine.  No significant abnormal sediment noted. Testicular exam is deferred, patient does not currently have any testicular related complaints or concerns.  He denies any penile pain.  GU exam is deferred. Musculoskeletal:        General: No deformity.     Cervical back: Normal range of motion.  Skin:    General: Skin is warm and dry.  Neurological:  Mental Status: He is alert. Mental status is at baseline.     Motor: No abnormal muscle tone.     Comments: Patient is awake and alert, he answers my questions appropriately without difficulty.  Psychiatric:        Mood and Affect: Mood normal.        Behavior: Behavior normal.     ED Results / Procedures / Treatments   Labs (all labs ordered are listed, but only abnormal results are displayed) Labs Reviewed  URINALYSIS, ROUTINE W REFLEX MICROSCOPIC - Abnormal; Notable for the following components:      Result Value   APPearance HAZY (*)    Hgb urine dipstick LARGE (*)    Protein, ur 30 (*)    Nitrite POSITIVE (*)    Leukocytes,Ua LARGE (*)    RBC / HPF >50 (*)    WBC, UA >50 (*)    Bacteria, UA FEW (*)    All other components within normal limits  COMPREHENSIVE METABOLIC PANEL - Abnormal; Notable for the following components:   Glucose, Bld 119 (*)    Creatinine, Ser 1.28 (*)    All other components within normal limits  CBC WITH DIFFERENTIAL/PLATELET - Abnormal; Notable for the following components:   WBC 10.6 (*)    Neutro Abs 8.5 (*)    All other components within normal limits  CK - Abnormal; Notable for the following components:   Total CK 46 (*)    All other components within normal limits  URINE CULTURE  LIPASE, BLOOD  TROPONIN I (HIGH SENSITIVITY)   TROPONIN I (HIGH SENSITIVITY)    EKG EKG Interpretation  Date/Time:  Sunday Nov 20 2020 15:34:01 EDT Ventricular Rate:  64 PR Interval:  140 QRS Duration: 76 QT Interval:  389 QTC Calculation: 402 R Axis:   80 Text Interpretation: Sinus rhythm Minimal ST elevation, inferior leads Confirmed by Norman Clay ([id number]) on 11/20/2020 3:36:37 PM   Radiology DG Chest 2 View  Result Date: 11/20/2020 CLINICAL DATA:  Epigastric abdominal pain. EXAM: CHEST - 2 VIEW COMPARISON:  Chest radiograph dated 10/27/2009 FINDINGS: The heart size and mediastinal contours are within normal limits. Both lungs are clear. The visualized skeletal structures are unremarkable. IMPRESSION: No active cardiopulmonary disease. Electronically Signed   By: Romona Curls M.D.   On: 11/20/2020 15:29   CT Renal Stone Study  Result Date: 11/20/2020 CLINICAL DATA:  Hematuria. Known solitary kidney with a staghorn calculus. Status post right nephrectomy. EXAM: CT ABDOMEN AND PELVIS WITHOUT CONTRAST TECHNIQUE: Multidetector CT imaging of the abdomen and pelvis was performed following the standard protocol without IV contrast. COMPARISON:  07/01/2020 FINDINGS: Lower chest: Mildly prominent interstitial markings with improvement. Stable borderline enlarged heart. Hepatobiliary: No focal liver abnormality is seen. No gallstones, gallbladder wall thickening, or biliary dilatation. Pancreas: Unremarkable. No pancreatic ductal dilatation or surrounding inflammatory changes. Spleen: Normal in size without focal abnormality. Adrenals/Urinary Tract: Normal appearing adrenal glands. Stable post lumpectomy changes on the right. Stable compensatory hypertrophy of the left kidney, left staghorn calculus and mild dilatation of the upper pole collecting system on the left. Stable focally dilated inferior left renal pelvis and proximal ureter with mild diffuse wall thickening and stable focal dilatation of a laterally deviated portion of the left ureter  at the level of the upper pelvis. No left ureteral calculus seen. Foley catheter in the urinary bladder with a tiny amount of associated air in the bladder. There is also some urine in the bladder. Stomach/Bowel: Stomach is  within normal limits. Appendix appears normal. No evidence of bowel wall thickening, distention, or inflammatory changes. Vascular/Lymphatic: No significant vascular findings are present. No enlarged abdominal or pelvic lymph nodes. Reproductive: Mildly to moderately enlarged prostate gland. Other: No abdominal wall hernia or abnormality. No abdominopelvic ascites. Musculoskeletal: Minimal lumbar and lower thoracic spine degenerative changes. IMPRESSION: 1. No acute abnormality. 2. Stable left mid and lower pole left renal staghorn calculus mildly dilated upper pole left renal collecting system and 2 areas of chronic, focal ureteral dilatation, as described above. 3. Stable post nephrectomy changes on the right with compensatory hypertrophy of the left kidney. 4. Stable mildly to moderately enlarged prostate gland. Electronically Signed   By: Beckie Salts M.D.   On: 11/20/2020 14:48    Procedures Procedures   Medications Ordered in ED Medications - No data to display  ED Course  I have reviewed the triage vital signs and the nursing notes.  Pertinent labs & imaging results that were available during my care of the patient were reviewed by me and considered in my medical decision making (see chart for details).  Clinical Course as of 11/20/20 1740  Sun Nov 20, 2020  1533 I spoke with Dr. Cardell Peach, on-call for urology.  He reviewed CT scan, we discussed lab results.  He recommends for now allowing the urine to culture out, not starting any new antibiotics and having patient follow-up in the office. [EH]    Clinical Course User Index [EH] Norman Clay   MDM Rules/Calculators/A&P                         Patient is a 55 year old man who presents today for evaluation of  2 concerns. Hematuria: patient states that over the past 10 days since he was being treated for a infection with doxycycline he has had blood in his urine.  This was seen and prescribed by his primary urologist after he had testicle pain.  He has completed the doxycycline however continues to have blood.  He has a single kidney raising concerns by family in terms that he may have a kidney stone given that he has a known staghorn stone. CT renal study is obtained, shows multiple chronic changes as noted without evidence of stone fragmentation/passage, or other acute intra-abdominal abnormalities. UA is obtained, is nitrite positive with a large amount of blood red cells and white cells and few bacteria seen.  Given that patient is a chronic catheter/Foley user he is expected to be bacteruric.  Urine culture is sent.  Patient is having fevers at home. Labs are obtained and reviewed, white count is slightly elevated at 10.6 however this appears improved compared to his previous labs 1 year ago.  His creatinine is slightly elevated at 1.26, 1 year ago this was 0.95.  Given that they were concerned about dark and discolored urine additionally I did obtain a CK which was not elevated. I spoke with Dr. Cardell Peach, on-call for urology, he reviewed scans.  Recommended outpatient follow-up, sending urine for culture and not starting any new antibiotics at this point.  Patient feels like he is able to fully empty his bladder using his Foley catheter and while he did report blood clots earlier does not appear to be obstructed or requiring irrigation at this time. Conservative care for this was discussed including increasing p.o. fluids, and basic troubleshooting for if his catheter is not draining well at home was discussed with patient and his mother.  Additionally patient complains of having half an hour of epigastric pain prior to arrival.  He did not have specific chest pain or shortness of breath.  He has minimal  epigastric tenderness on exam, his CT renal study did not show any significant abnormalities, his CMP and lipase are both not elevated.  Troponin and chest x-ray were obtained without acute cause for his complaints found. He has been taking meloxicam over the past 10 days for pain related to his testicle swelling that has since resolved.  I recommended that they stop this.  The pain started after he ate a few bites of a hot sausage.  I suspect that his pain is related to gastritis secondary to NSAID use.  Antacids are considered, however given that he has 1 kidney with known stones for now we will hold on these, instead recommended a bland diet and increase p.o. hydration.    Given nature of his symptoms starting after he ate a hot spicy food In the setting of NSAID use and it resolved prior to arrival with normal troponin and EKG I doubt significant cardiopulmonary cause of this pain.  This is additionally supported with epigastric mild tenderness to palpation.  Recommended general conservative care for this along with outpatient follow-up.  Return precautions were discussed with patient and his mother.  Return precautions were discussed with the parent/patient who states their understanding.  At the time of discharge parent/patient denied any unaddressed complaints or concerns.  Parent/patient is agreeable for discharge home.  Note: Portions of this report may have been transcribed using voice recognition software. Every effort was made to ensure accuracy; however, inadvertent computerized transcription errors may be present  Final Clinical Impression(s) / ED Diagnoses Final diagnoses:  Hematuria, unspecified type  Epigastric abdominal pain    Rx / DC Orders ED Discharge Orders    None       Norman Clay 11/20/20 1749    Cheryll Cockayne, MD 11/25/20 [phone]

## 2020-11-20 NOTE — ED Notes (Signed)
Pt arrived via POV, urinary catheter in place. States changed yesterday. C/o blood and blood clots in urine. States no issue draining urine. States changed catheter this morning.   Catheter emptied 500cc urine.

## 2020-11-22 LAB — URINE CULTURE: Culture: >=100000 — AB

## 2020-11-23 ENCOUNTER — Telehealth: Payer: Self-pay | Admitting: Emergency Medicine

## 2020-11-23 NOTE — Telephone Encounter (Signed)
Urine culture report faxed to Dr Cardell Peach @ [facility] [phone]

## 2020-11-23 NOTE — Progress Notes (Signed)
ED Antimicrobial Stewardship Positive Culture Follow Up   Philip Stark is an 55 y.o. male who presented to [facility] on 11/20/2020 with a chief complaint of  Chief Complaint  Patient presents with  . Hematuria    Recent Results (from the past 720 hour(s))  Urine culture     Status: Abnormal   Collection Time: 11/20/20  1:56 PM   Specimen: Urine, Random  Result Value Ref Range Status   Specimen Description   Final    URINE, RANDOM Performed at [facility] [address], [place], [place] [address]    Special Requests   Final    NONE Performed at [facility], [address] [place], [place] [address]    Culture >=100,000 COLONIES/mL PROTEUS MIRABILIS (A)  Final   Report Status 11/22/2020 FINAL  Final   Organism ID, Bacteria PROTEUS MIRABILIS (A)  Final      Susceptibility   Proteus mirabilis - MIC*    AMPICILLIN <=2 SENSITIVE Sensitive     CEFAZOLIN <=4 SENSITIVE Sensitive     CEFEPIME <=0.12 SENSITIVE Sensitive     CEFTRIAXONE <=0.25 SENSITIVE Sensitive     CIPROFLOXACIN <=0.25 SENSITIVE Sensitive     GENTAMICIN <=1 SENSITIVE Sensitive     IMIPENEM 4 SENSITIVE Sensitive     NITROFURANTOIN 128 RESISTANT Resistant     TRIMETH/SULFA <=20 SENSITIVE Sensitive     AMPICILLIN/SULBACTAM <=2 SENSITIVE Sensitive     PIP/TAZO <=4 SENSITIVE Sensitive     * >=100,000 COLONIES/mL PROTEUS MIRABILIS   Follow up with [facility] outpatient, will fax results  ED Provider: Barry Brunner, PA-C   Philip Stark 11/23/2020, 10:57 AM Student Pharmacist

## 2020-11-25 DIAGNOSIS — Z905 Acquired absence of kidney: Secondary | ICD-10-CM | POA: Diagnosis not present

## 2020-11-25 DIAGNOSIS — R8271 Bacteriuria: Secondary | ICD-10-CM | POA: Diagnosis not present

## 2020-11-25 DIAGNOSIS — N200 Calculus of kidney: Secondary | ICD-10-CM | POA: Diagnosis not present

## 2020-12-15 DIAGNOSIS — Z01818 Encounter for other preprocedural examination: Secondary | ICD-10-CM | POA: Diagnosis not present

## 2020-12-15 DIAGNOSIS — N200 Calculus of kidney: Secondary | ICD-10-CM | POA: Diagnosis not present

## 2020-12-16 DIAGNOSIS — R339 Retention of urine, unspecified: Secondary | ICD-10-CM | POA: Diagnosis not present

## 2020-12-26 DIAGNOSIS — Z01812 Encounter for preprocedural laboratory examination: Secondary | ICD-10-CM | POA: Diagnosis not present

## 2020-12-29 DIAGNOSIS — N200 Calculus of kidney: Secondary | ICD-10-CM | POA: Diagnosis not present

## 2020-12-29 DIAGNOSIS — Z20822 Contact with and (suspected) exposure to covid-19: Secondary | ICD-10-CM | POA: Diagnosis not present

## 2020-12-29 DIAGNOSIS — Z881 Allergy status to other antibiotic agents status: Secondary | ICD-10-CM | POA: Diagnosis not present

## 2020-12-29 DIAGNOSIS — Z91040 Latex allergy status: Secondary | ICD-10-CM | POA: Diagnosis not present

## 2020-12-29 DIAGNOSIS — G8918 Other acute postprocedural pain: Secondary | ICD-10-CM | POA: Diagnosis not present

## 2020-12-29 DIAGNOSIS — Z882 Allergy status to sulfonamides status: Secondary | ICD-10-CM | POA: Diagnosis not present

## 2020-12-29 DIAGNOSIS — N132 Hydronephrosis with renal and ureteral calculous obstruction: Secondary | ICD-10-CM | POA: Diagnosis not present

## 2020-12-29 DIAGNOSIS — J9811 Atelectasis: Secondary | ICD-10-CM | POA: Diagnosis not present

## 2020-12-29 DIAGNOSIS — H9190 Unspecified hearing loss, unspecified ear: Secondary | ICD-10-CM | POA: Diagnosis not present

## 2020-12-29 DIAGNOSIS — Z79899 Other long term (current) drug therapy: Secondary | ICD-10-CM | POA: Diagnosis not present

## 2020-12-29 DIAGNOSIS — F819 Developmental disorder of scholastic skills, unspecified: Secondary | ICD-10-CM | POA: Diagnosis not present

## 2020-12-29 DIAGNOSIS — Z888 Allergy status to other drugs, medicaments and biological substances status: Secondary | ICD-10-CM | POA: Diagnosis not present

## 2020-12-29 DIAGNOSIS — N2886 Ureteritis cystica: Secondary | ICD-10-CM | POA: Diagnosis not present

## 2020-12-29 DIAGNOSIS — Q600 Renal agenesis, unilateral: Secondary | ICD-10-CM | POA: Diagnosis not present

## 2020-12-29 DIAGNOSIS — Z880 Allergy status to penicillin: Secondary | ICD-10-CM | POA: Diagnosis not present

## 2020-12-30 DIAGNOSIS — Z20822 Contact with and (suspected) exposure to covid-19: Secondary | ICD-10-CM | POA: Diagnosis not present

## 2020-12-30 DIAGNOSIS — Z79899 Other long term (current) drug therapy: Secondary | ICD-10-CM | POA: Diagnosis not present

## 2020-12-30 DIAGNOSIS — Z888 Allergy status to other drugs, medicaments and biological substances status: Secondary | ICD-10-CM | POA: Diagnosis not present

## 2020-12-30 DIAGNOSIS — J9811 Atelectasis: Secondary | ICD-10-CM | POA: Diagnosis not present

## 2020-12-30 DIAGNOSIS — F819 Developmental disorder of scholastic skills, unspecified: Secondary | ICD-10-CM | POA: Diagnosis not present

## 2020-12-30 DIAGNOSIS — N200 Calculus of kidney: Secondary | ICD-10-CM | POA: Diagnosis not present

## 2020-12-30 DIAGNOSIS — Z91040 Latex allergy status: Secondary | ICD-10-CM | POA: Diagnosis not present

## 2020-12-30 DIAGNOSIS — N132 Hydronephrosis with renal and ureteral calculous obstruction: Secondary | ICD-10-CM | POA: Diagnosis not present

## 2020-12-30 DIAGNOSIS — Z882 Allergy status to sulfonamides status: Secondary | ICD-10-CM | POA: Diagnosis not present

## 2020-12-30 DIAGNOSIS — Z880 Allergy status to penicillin: Secondary | ICD-10-CM | POA: Diagnosis not present

## 2020-12-30 DIAGNOSIS — H9190 Unspecified hearing loss, unspecified ear: Secondary | ICD-10-CM | POA: Diagnosis not present

## 2020-12-30 DIAGNOSIS — Q600 Renal agenesis, unilateral: Secondary | ICD-10-CM | POA: Diagnosis not present

## 2020-12-30 DIAGNOSIS — Z881 Allergy status to other antibiotic agents status: Secondary | ICD-10-CM | POA: Diagnosis not present

## 2020-12-30 DIAGNOSIS — N1330 Unspecified hydronephrosis: Secondary | ICD-10-CM | POA: Diagnosis not present

## 2020-12-31 DIAGNOSIS — H9190 Unspecified hearing loss, unspecified ear: Secondary | ICD-10-CM | POA: Diagnosis not present

## 2020-12-31 DIAGNOSIS — Z20822 Contact with and (suspected) exposure to covid-19: Secondary | ICD-10-CM | POA: Diagnosis not present

## 2020-12-31 DIAGNOSIS — Z881 Allergy status to other antibiotic agents status: Secondary | ICD-10-CM | POA: Diagnosis not present

## 2020-12-31 DIAGNOSIS — N132 Hydronephrosis with renal and ureteral calculous obstruction: Secondary | ICD-10-CM | POA: Diagnosis not present

## 2020-12-31 DIAGNOSIS — Z882 Allergy status to sulfonamides status: Secondary | ICD-10-CM | POA: Diagnosis not present

## 2020-12-31 DIAGNOSIS — F819 Developmental disorder of scholastic skills, unspecified: Secondary | ICD-10-CM | POA: Diagnosis not present

## 2020-12-31 DIAGNOSIS — Z880 Allergy status to penicillin: Secondary | ICD-10-CM | POA: Diagnosis not present

## 2020-12-31 DIAGNOSIS — Z888 Allergy status to other drugs, medicaments and biological substances status: Secondary | ICD-10-CM | POA: Diagnosis not present

## 2020-12-31 DIAGNOSIS — Q600 Renal agenesis, unilateral: Secondary | ICD-10-CM | POA: Diagnosis not present

## 2020-12-31 DIAGNOSIS — J9811 Atelectasis: Secondary | ICD-10-CM | POA: Diagnosis not present

## 2020-12-31 DIAGNOSIS — Z79899 Other long term (current) drug therapy: Secondary | ICD-10-CM | POA: Diagnosis not present

## 2020-12-31 DIAGNOSIS — Z91040 Latex allergy status: Secondary | ICD-10-CM | POA: Diagnosis not present

## 2021-01-04 DIAGNOSIS — N200 Calculus of kidney: Secondary | ICD-10-CM | POA: Diagnosis not present

## 2021-01-12 DIAGNOSIS — N200 Calculus of kidney: Secondary | ICD-10-CM | POA: Diagnosis not present

## 2021-01-13 DIAGNOSIS — R339 Retention of urine, unspecified: Secondary | ICD-10-CM | POA: Diagnosis not present

## 2021-01-23 DIAGNOSIS — H903 Sensorineural hearing loss, bilateral: Secondary | ICD-10-CM | POA: Diagnosis not present

## 2021-01-23 DIAGNOSIS — Z9621 Cochlear implant status: Secondary | ICD-10-CM | POA: Diagnosis not present

## 2021-01-23 DIAGNOSIS — Z4889 Encounter for other specified surgical aftercare: Secondary | ICD-10-CM | POA: Diagnosis not present

## 2021-02-06 DIAGNOSIS — Q600 Renal agenesis, unilateral: Secondary | ICD-10-CM | POA: Diagnosis not present

## 2021-02-06 DIAGNOSIS — Z882 Allergy status to sulfonamides status: Secondary | ICD-10-CM | POA: Diagnosis not present

## 2021-02-06 DIAGNOSIS — N134 Hydroureter: Secondary | ICD-10-CM | POA: Diagnosis not present

## 2021-02-06 DIAGNOSIS — Z880 Allergy status to penicillin: Secondary | ICD-10-CM | POA: Diagnosis not present

## 2021-02-06 DIAGNOSIS — N2882 Megaloureter: Secondary | ICD-10-CM | POA: Diagnosis not present

## 2021-02-06 DIAGNOSIS — Z9621 Cochlear implant status: Secondary | ICD-10-CM | POA: Diagnosis not present

## 2021-02-06 DIAGNOSIS — E785 Hyperlipidemia, unspecified: Secondary | ICD-10-CM | POA: Diagnosis not present

## 2021-02-06 DIAGNOSIS — N200 Calculus of kidney: Secondary | ICD-10-CM | POA: Diagnosis not present

## 2021-02-06 DIAGNOSIS — F819 Developmental disorder of scholastic skills, unspecified: Secondary | ICD-10-CM | POA: Diagnosis not present

## 2021-02-06 DIAGNOSIS — Z79899 Other long term (current) drug therapy: Secondary | ICD-10-CM | POA: Diagnosis not present

## 2021-02-06 DIAGNOSIS — H9193 Unspecified hearing loss, bilateral: Secondary | ICD-10-CM | POA: Diagnosis not present

## 2021-02-06 DIAGNOSIS — Z91040 Latex allergy status: Secondary | ICD-10-CM | POA: Diagnosis not present

## 2021-02-17 DIAGNOSIS — N200 Calculus of kidney: Secondary | ICD-10-CM | POA: Diagnosis not present

## 2021-02-17 DIAGNOSIS — Z466 Encounter for fitting and adjustment of urinary device: Secondary | ICD-10-CM | POA: Diagnosis not present

## 2021-04-01 DIAGNOSIS — Z23 Encounter for immunization: Secondary | ICD-10-CM | POA: Diagnosis not present

## 2021-04-11 DIAGNOSIS — Z881 Allergy status to other antibiotic agents status: Secondary | ICD-10-CM | POA: Diagnosis not present

## 2021-04-11 DIAGNOSIS — Z882 Allergy status to sulfonamides status: Secondary | ICD-10-CM | POA: Diagnosis not present

## 2021-04-11 DIAGNOSIS — N200 Calculus of kidney: Secondary | ICD-10-CM | POA: Diagnosis not present

## 2021-04-11 DIAGNOSIS — Z905 Acquired absence of kidney: Secondary | ICD-10-CM | POA: Diagnosis not present

## 2021-04-11 DIAGNOSIS — Z79899 Other long term (current) drug therapy: Secondary | ICD-10-CM | POA: Diagnosis not present

## 2021-04-11 DIAGNOSIS — R339 Retention of urine, unspecified: Secondary | ICD-10-CM | POA: Diagnosis not present

## 2021-04-11 DIAGNOSIS — Q600 Renal agenesis, unilateral: Secondary | ICD-10-CM | POA: Diagnosis not present

## 2021-04-11 DIAGNOSIS — Z880 Allergy status to penicillin: Secondary | ICD-10-CM | POA: Diagnosis not present

## 2021-04-11 DIAGNOSIS — H9190 Unspecified hearing loss, unspecified ear: Secondary | ICD-10-CM | POA: Diagnosis not present

## 2021-04-11 DIAGNOSIS — F819 Developmental disorder of scholastic skills, unspecified: Secondary | ICD-10-CM | POA: Diagnosis not present

## 2021-08-11 DIAGNOSIS — R519 Headache, unspecified: Secondary | ICD-10-CM | POA: Diagnosis not present

## 2021-08-11 DIAGNOSIS — H0102A Squamous blepharitis right eye, upper and lower eyelids: Secondary | ICD-10-CM | POA: Diagnosis not present

## 2021-08-11 DIAGNOSIS — H0102B Squamous blepharitis left eye, upper and lower eyelids: Secondary | ICD-10-CM | POA: Diagnosis not present

## 2021-09-08 DIAGNOSIS — Z0000 Encounter for general adult medical examination without abnormal findings: Secondary | ICD-10-CM | POA: Diagnosis not present

## 2021-09-08 DIAGNOSIS — N200 Calculus of kidney: Secondary | ICD-10-CM | POA: Diagnosis not present

## 2021-09-08 DIAGNOSIS — Z800 Family history of malignant neoplasm of digestive organs: Secondary | ICD-10-CM | POA: Diagnosis not present

## 2021-09-08 DIAGNOSIS — E785 Hyperlipidemia, unspecified: Secondary | ICD-10-CM | POA: Diagnosis not present

## 2021-09-08 DIAGNOSIS — F819 Developmental disorder of scholastic skills, unspecified: Secondary | ICD-10-CM | POA: Diagnosis not present

## 2021-09-08 DIAGNOSIS — Z905 Acquired absence of kidney: Secondary | ICD-10-CM | POA: Diagnosis not present

## 2021-09-08 DIAGNOSIS — H905 Unspecified sensorineural hearing loss: Secondary | ICD-10-CM | POA: Diagnosis not present

## 2021-09-08 DIAGNOSIS — N3090 Cystitis, unspecified without hematuria: Secondary | ICD-10-CM | POA: Diagnosis not present

## 2021-10-06 DIAGNOSIS — D582 Other hemoglobinopathies: Secondary | ICD-10-CM | POA: Diagnosis not present

## 2021-10-06 DIAGNOSIS — E785 Hyperlipidemia, unspecified: Secondary | ICD-10-CM | POA: Diagnosis not present

## 2021-10-10 DIAGNOSIS — N200 Calculus of kidney: Secondary | ICD-10-CM | POA: Diagnosis not present

## 2021-10-10 DIAGNOSIS — N390 Urinary tract infection, site not specified: Secondary | ICD-10-CM | POA: Diagnosis not present

## 2021-10-10 DIAGNOSIS — R339 Retention of urine, unspecified: Secondary | ICD-10-CM | POA: Diagnosis not present

## 2021-11-22 DIAGNOSIS — N200 Calculus of kidney: Secondary | ICD-10-CM | POA: Diagnosis not present

## 2021-11-22 DIAGNOSIS — N390 Urinary tract infection, site not specified: Secondary | ICD-10-CM | POA: Diagnosis not present

## 2022-01-01 DIAGNOSIS — Z905 Acquired absence of kidney: Secondary | ICD-10-CM | POA: Diagnosis not present

## 2022-01-01 DIAGNOSIS — N200 Calculus of kidney: Secondary | ICD-10-CM | POA: Diagnosis not present

## 2022-01-01 DIAGNOSIS — R8271 Bacteriuria: Secondary | ICD-10-CM | POA: Diagnosis not present

## 2022-01-24 DIAGNOSIS — N200 Calculus of kidney: Secondary | ICD-10-CM | POA: Diagnosis not present

## 2022-01-25 DIAGNOSIS — N200 Calculus of kidney: Secondary | ICD-10-CM | POA: Diagnosis not present

## 2022-03-08 DIAGNOSIS — D582 Other hemoglobinopathies: Secondary | ICD-10-CM | POA: Diagnosis not present

## 2022-03-08 DIAGNOSIS — Z23 Encounter for immunization: Secondary | ICD-10-CM | POA: Diagnosis not present

## 2022-03-08 DIAGNOSIS — R944 Abnormal results of kidney function studies: Secondary | ICD-10-CM | POA: Diagnosis not present

## 2022-03-08 DIAGNOSIS — Z905 Acquired absence of kidney: Secondary | ICD-10-CM | POA: Diagnosis not present

## 2022-03-08 DIAGNOSIS — E785 Hyperlipidemia, unspecified: Secondary | ICD-10-CM | POA: Diagnosis not present

## 2022-03-08 DIAGNOSIS — M25561 Pain in right knee: Secondary | ICD-10-CM | POA: Diagnosis not present

## 2022-03-12 DIAGNOSIS — M25561 Pain in right knee: Secondary | ICD-10-CM | POA: Diagnosis not present

## 2022-03-12 DIAGNOSIS — M25562 Pain in left knee: Secondary | ICD-10-CM | POA: Diagnosis not present

## 2022-03-17 IMAGING — US US SCROTUM
1 series · 13 of 25 positions shown · non-contrast
Comparison: Scrotal Doppler ultrasound 10/23/2009. Noncontrast CT
Abdomen and Pelvis 08/29/2018.

CLINICAL DATA: 53-year-old male with right side scrotal pain.

EXAM:
SCROTAL ULTRASOUND
DOPPLER ULTRASOUND OF THE TESTICLES
TECHNIQUE: Complete ultrasound examination of the testicles, epididymis, and
other scrotal structures was performed. Color and spectral Doppler
ultrasound were also utilized to evaluate blood flow to the
testicles.

[Series 1: us scrotum · 13 of 90 slices shown]
[im 1/90]
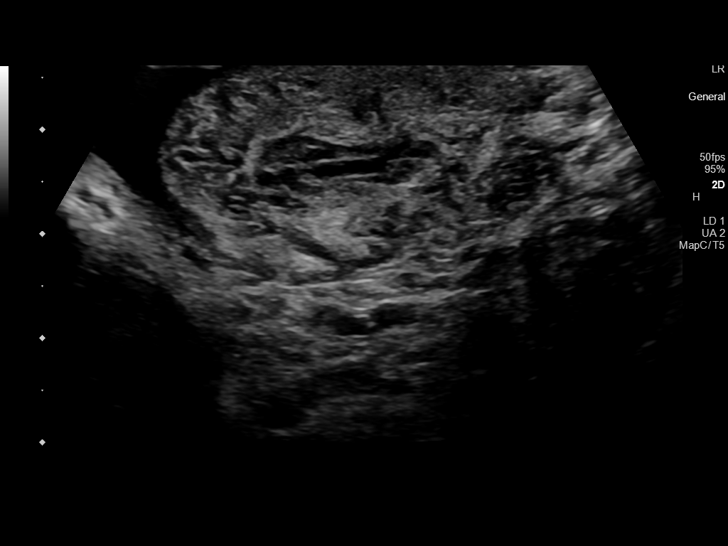
[im 8/90]
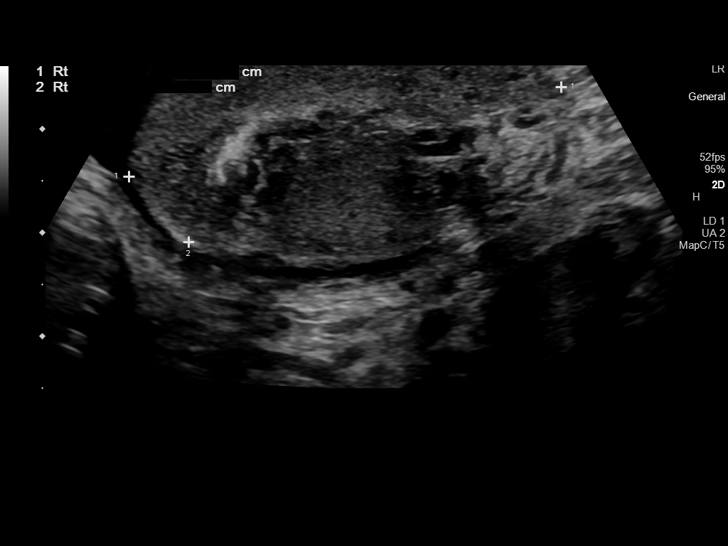
[im 15/90]
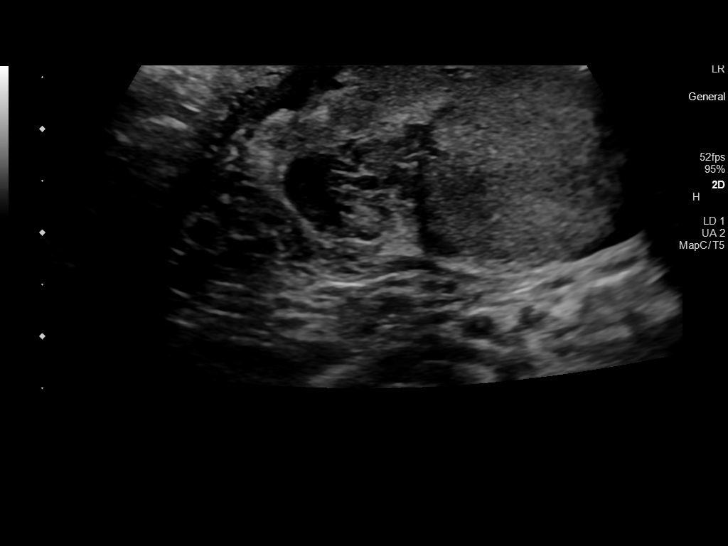
[im 23/90]
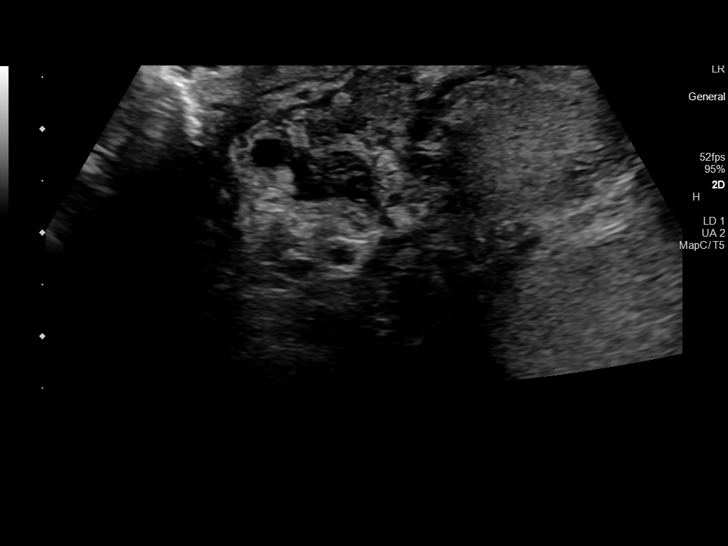
[im 30/90]
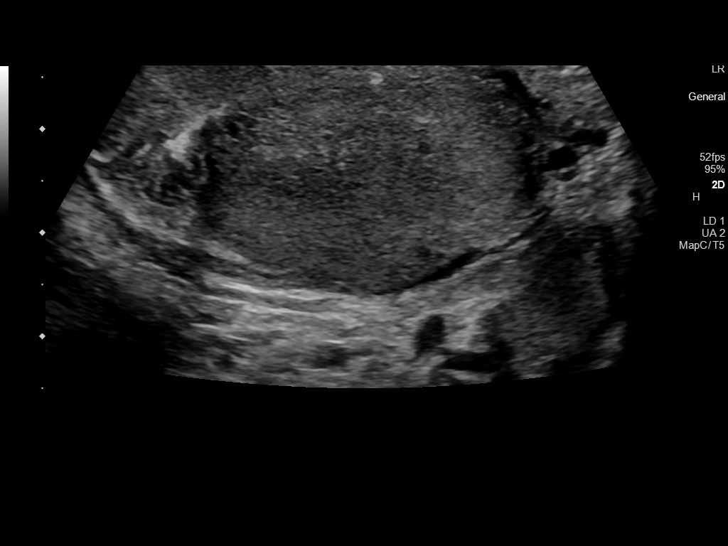
[im 38/90]
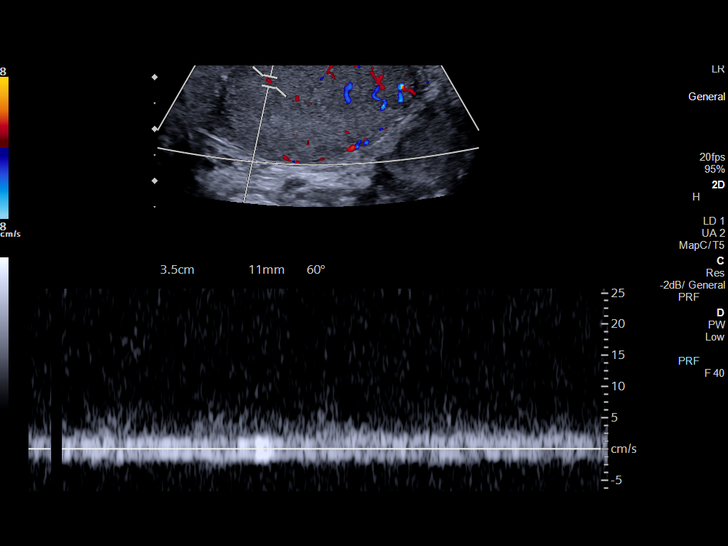
[im 45/90]
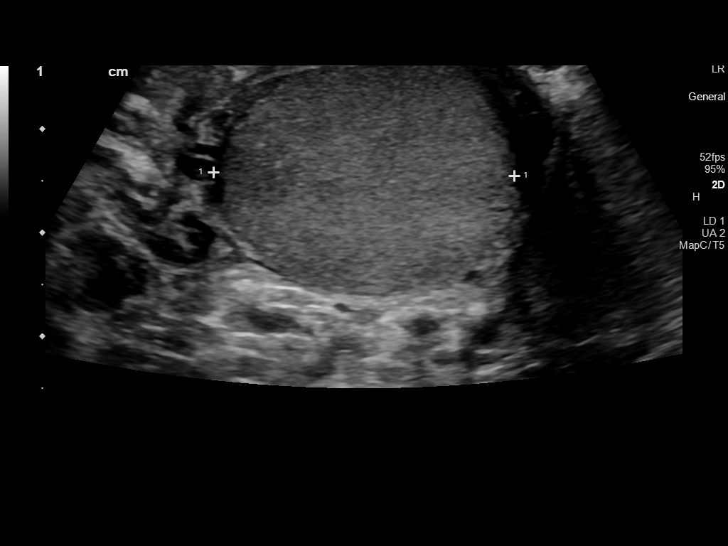
[im 52/90]
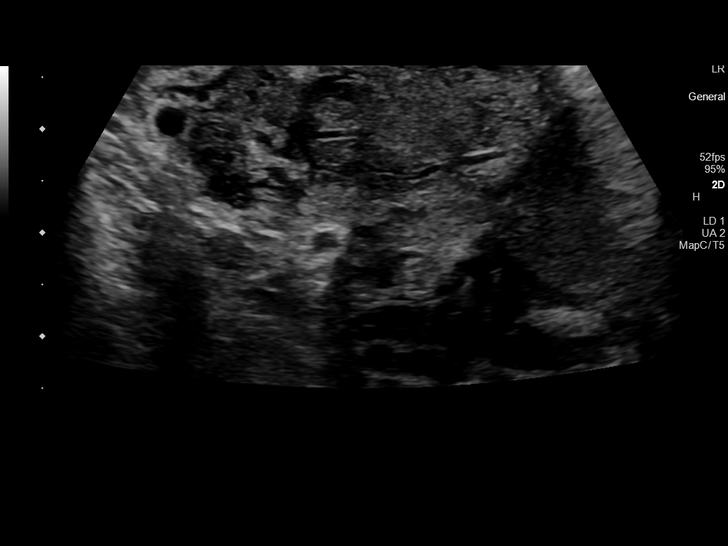
[im 60/90]
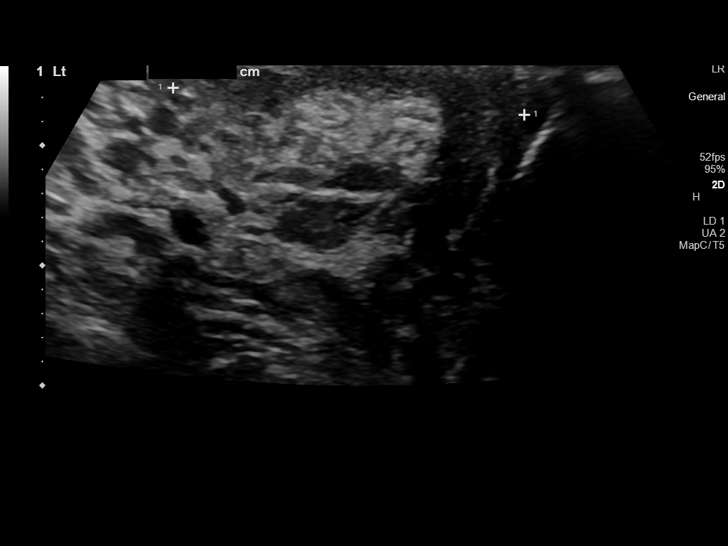
[im 67/90]
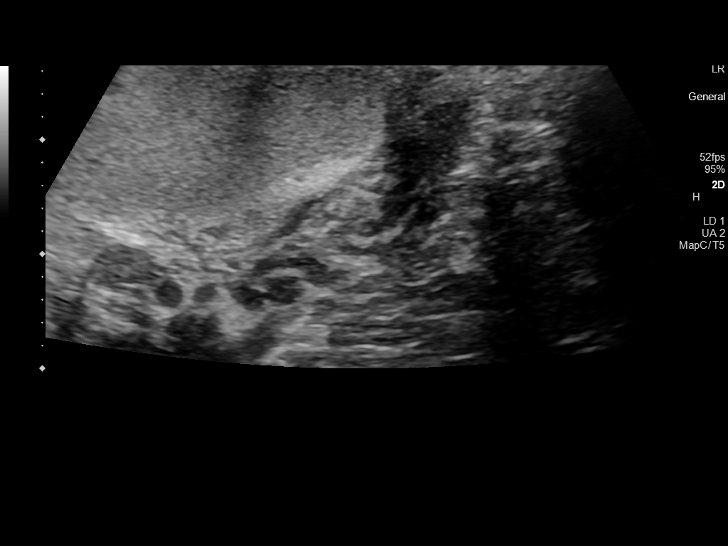
[im 75/90]
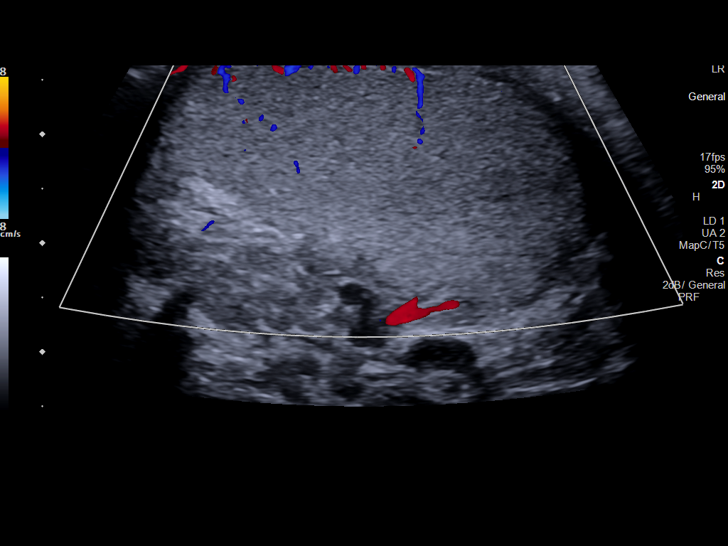
[im 82/90]
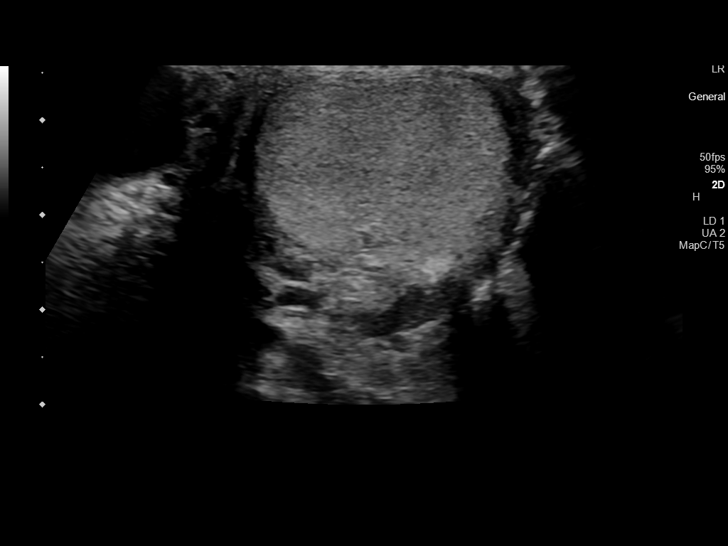
[im 90/90]
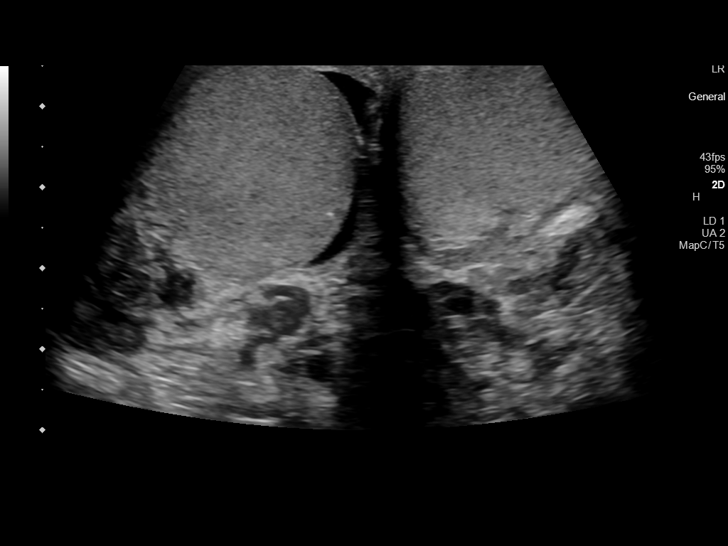

[13 of 25 positions shown; findings below may reference images not displayed]

FINDINGS: Right testicle

Measurements: 3.9 x 2.4 x 2.9 cm. No mass or microlithiasis
visualized.

Left testicle

Measurements: 4.0 x 2.1 x 2.7 cm. No mass or microlithiasis
visualized.

Right epididymis: The right epididymis appears enlarged, more
heterogeneous, and more hypervascular than in 9700 (image 7).

Left epididymis: The left epididymis also appears enlarged and
heterogeneous, but is without hypervascularity (image 64).

Hydrocele: Small simple appearing right side hydrocele, increased
since [DATE]. Left-side hydrocele has decreased or resolved.

Varicocele:  Borderline to mild on the left.

Pulsed Doppler interrogation of both testes demonstrates normal low
resistance arterial and venous waveforms bilaterally. Furthermore,
the right testicle is hypervascular (image 89).
IMPRESSION: 1. Negative for testicular mass or torsion.
2. Positive for hypervascular right testis and epididymis compatible
with Acute Epididymoorchitis. Small right hydrocele is likely
reactive.
3. Heterogeneity of the left epididymis without hypervascularity may
be postinflammatory from the 9700 findings.
4. Borderline to mild left side varicocele.

## 2022-03-22 ENCOUNTER — Telehealth: Payer: Self-pay

## 2022-03-23 DIAGNOSIS — R269 Unspecified abnormalities of gait and mobility: Secondary | ICD-10-CM | POA: Diagnosis not present

## 2022-03-23 DIAGNOSIS — M25661 Stiffness of right knee, not elsewhere classified: Secondary | ICD-10-CM | POA: Diagnosis not present

## 2022-03-23 DIAGNOSIS — M25561 Pain in right knee: Secondary | ICD-10-CM | POA: Diagnosis not present

## 2022-03-23 DIAGNOSIS — M25562 Pain in left knee: Secondary | ICD-10-CM | POA: Diagnosis not present

## 2022-03-23 DIAGNOSIS — M25662 Stiffness of left knee, not elsewhere classified: Secondary | ICD-10-CM | POA: Diagnosis not present

## 2022-03-26 DIAGNOSIS — M25662 Stiffness of left knee, not elsewhere classified: Secondary | ICD-10-CM | POA: Diagnosis not present

## 2022-03-26 DIAGNOSIS — M25561 Pain in right knee: Secondary | ICD-10-CM | POA: Diagnosis not present

## 2022-03-26 DIAGNOSIS — R269 Unspecified abnormalities of gait and mobility: Secondary | ICD-10-CM | POA: Diagnosis not present

## 2022-03-26 DIAGNOSIS — M25661 Stiffness of right knee, not elsewhere classified: Secondary | ICD-10-CM | POA: Diagnosis not present

## 2022-03-26 DIAGNOSIS — M25562 Pain in left knee: Secondary | ICD-10-CM | POA: Diagnosis not present

## 2022-04-02 DIAGNOSIS — M25661 Stiffness of right knee, not elsewhere classified: Secondary | ICD-10-CM | POA: Diagnosis not present

## 2022-04-02 DIAGNOSIS — R269 Unspecified abnormalities of gait and mobility: Secondary | ICD-10-CM | POA: Diagnosis not present

## 2022-04-02 DIAGNOSIS — M25562 Pain in left knee: Secondary | ICD-10-CM | POA: Diagnosis not present

## 2022-04-02 DIAGNOSIS — M25561 Pain in right knee: Secondary | ICD-10-CM | POA: Diagnosis not present

## 2022-04-02 DIAGNOSIS — M25662 Stiffness of left knee, not elsewhere classified: Secondary | ICD-10-CM | POA: Diagnosis not present

## 2022-04-09 DIAGNOSIS — R8271 Bacteriuria: Secondary | ICD-10-CM | POA: Diagnosis not present

## 2022-04-09 DIAGNOSIS — M25561 Pain in right knee: Secondary | ICD-10-CM | POA: Diagnosis not present

## 2022-04-09 DIAGNOSIS — N200 Calculus of kidney: Secondary | ICD-10-CM | POA: Diagnosis not present

## 2022-04-09 DIAGNOSIS — Z905 Acquired absence of kidney: Secondary | ICD-10-CM | POA: Diagnosis not present

## 2022-04-14 DIAGNOSIS — Z23 Encounter for immunization: Secondary | ICD-10-CM | POA: Diagnosis not present

## 2022-07-10 DIAGNOSIS — Z905 Acquired absence of kidney: Secondary | ICD-10-CM | POA: Diagnosis not present

## 2022-07-10 DIAGNOSIS — N200 Calculus of kidney: Secondary | ICD-10-CM | POA: Diagnosis not present

## 2022-07-10 DIAGNOSIS — R8271 Bacteriuria: Secondary | ICD-10-CM | POA: Diagnosis not present

## 2022-08-03 DIAGNOSIS — H903 Sensorineural hearing loss, bilateral: Secondary | ICD-10-CM | POA: Diagnosis not present

## 2022-09-10 DIAGNOSIS — F819 Developmental disorder of scholastic skills, unspecified: Secondary | ICD-10-CM | POA: Diagnosis not present

## 2022-09-10 DIAGNOSIS — Z905 Acquired absence of kidney: Secondary | ICD-10-CM | POA: Diagnosis not present

## 2022-09-10 DIAGNOSIS — Z800 Family history of malignant neoplasm of digestive organs: Secondary | ICD-10-CM | POA: Diagnosis not present

## 2022-09-10 DIAGNOSIS — Z0000 Encounter for general adult medical examination without abnormal findings: Secondary | ICD-10-CM | POA: Diagnosis not present

## 2022-09-10 DIAGNOSIS — E785 Hyperlipidemia, unspecified: Secondary | ICD-10-CM | POA: Diagnosis not present

## 2022-09-10 DIAGNOSIS — D582 Other hemoglobinopathies: Secondary | ICD-10-CM | POA: Diagnosis not present

## 2022-09-10 DIAGNOSIS — N200 Calculus of kidney: Secondary | ICD-10-CM | POA: Diagnosis not present

## 2022-09-10 DIAGNOSIS — Z1159 Encounter for screening for other viral diseases: Secondary | ICD-10-CM | POA: Diagnosis not present

## 2022-09-12 DIAGNOSIS — Z800 Family history of malignant neoplasm of digestive organs: Secondary | ICD-10-CM | POA: Diagnosis not present

## 2022-09-18 DIAGNOSIS — H0102B Squamous blepharitis left eye, upper and lower eyelids: Secondary | ICD-10-CM | POA: Diagnosis not present

## 2022-09-18 DIAGNOSIS — R519 Headache, unspecified: Secondary | ICD-10-CM | POA: Diagnosis not present

## 2022-09-18 DIAGNOSIS — H2513 Age-related nuclear cataract, bilateral: Secondary | ICD-10-CM | POA: Diagnosis not present

## 2022-09-18 DIAGNOSIS — H0102A Squamous blepharitis right eye, upper and lower eyelids: Secondary | ICD-10-CM | POA: Diagnosis not present

## 2022-10-09 DIAGNOSIS — H903 Sensorineural hearing loss, bilateral: Secondary | ICD-10-CM | POA: Diagnosis not present

## 2022-10-15 DIAGNOSIS — E785 Hyperlipidemia, unspecified: Secondary | ICD-10-CM | POA: Diagnosis not present

## 2022-10-25 DIAGNOSIS — H903 Sensorineural hearing loss, bilateral: Secondary | ICD-10-CM | POA: Diagnosis not present

## 2023-03-12 DIAGNOSIS — M25561 Pain in right knee: Secondary | ICD-10-CM | POA: Diagnosis not present

## 2023-03-12 DIAGNOSIS — E785 Hyperlipidemia, unspecified: Secondary | ICD-10-CM | POA: Diagnosis not present

## 2023-03-12 DIAGNOSIS — R208 Other disturbances of skin sensation: Secondary | ICD-10-CM | POA: Diagnosis not present

## 2023-03-12 DIAGNOSIS — D582 Other hemoglobinopathies: Secondary | ICD-10-CM | POA: Diagnosis not present

## 2023-03-26 DIAGNOSIS — M25561 Pain in right knee: Secondary | ICD-10-CM | POA: Diagnosis not present

## 2023-04-02 NOTE — Progress Notes (Signed)
treated

## 2023-04-30 DIAGNOSIS — M25561 Pain in right knee: Secondary | ICD-10-CM | POA: Diagnosis not present

## 2023-07-15 DIAGNOSIS — R8271 Bacteriuria: Secondary | ICD-10-CM | POA: Diagnosis not present

## 2023-07-15 DIAGNOSIS — N200 Calculus of kidney: Secondary | ICD-10-CM | POA: Diagnosis not present

## 2023-07-15 DIAGNOSIS — Z905 Acquired absence of kidney: Secondary | ICD-10-CM | POA: Diagnosis not present

## 2023-07-25 ENCOUNTER — Other Ambulatory Visit: Payer: Self-pay | Admitting: Urology

## 2023-07-26 NOTE — Progress Notes (Signed)
 Sent message, via epic in basket, requesting orders in epic from Care[facility] adviser.

## 2023-07-29 DIAGNOSIS — H903 Sensorineural hearing loss, bilateral: Secondary | ICD-10-CM | POA: Diagnosis not present

## 2023-07-29 NOTE — Patient Instructions (Addendum)
 SURGICAL WAITING ROOM VISITATION  Patients having surgery or a procedure may have no more than 2 support people in the waiting area - these visitors may rotate.    Children under the age of 1 must have an adult with them who is not the patient.  Due to an increase in RSV and influenza rates and associated hospitalizations, children ages 77 and under may not visit patients in [facility] hospitals.  If the patient needs to stay at the hospital during part of their recovery, the visitor guidelines for inpatient rooms apply. Pre-op nurse will coordinate an appropriate time for 1 support person to accompany patient in pre-op.  This support person may not rotate.    Please refer to the [facility] website for the visitor guidelines for Inpatients (after your surgery is over and you are in a regular room).    Your procedure is scheduled on: 08/02/23   Report to [facility] Main Entrance    Report to admitting at 11:45 AM   Call this number if you have problems the morning of surgery [phone]   Do not eat food or drink liquids :After Midnight.          If you have questions, please contact your surgeon's office.   FOLLOW BOWEL PREP AND ANY ADDITIONAL PRE OP INSTRUCTIONS YOU RECEIVED FROM YOUR SURGEON'S OFFICE!!!     Oral Hygiene is also important to reduce your risk of infection.                                    Remember - BRUSH YOUR TEETH THE MORNING OF SURGERY WITH YOUR REGULAR TOOTHPASTE  DENTURES WILL BE REMOVED PRIOR TO SURGERY PLEASE DO NOT APPLY Poly grip OR ADHESIVES!!!   Stop all vitamins and herbal supplements 7 days before surgery.   Take these medicines the morning of surgery with A SIP OF WATER: None              You may not have any metal on your body including jewelry, and body piercing             Do not wear lotions, powders, cologne, or deodorant              Men may shave face and neck.   Do not bring valuables to the hospital. [facility] IS  NOT             RESPONSIBLE   FOR VALUABLES.   Contacts, glasses, dentures or bridgework may not be worn into surgery.  DO NOT BRING YOUR HOME MEDICATIONS TO THE HOSPITAL. PHARMACY WILL DISPENSE MEDICATIONS LISTED ON YOUR MEDICATION LIST TO YOU DURING YOUR ADMISSION IN THE HOSPITAL!    Patients discharged on the day of surgery will not be allowed to drive home.  Someone NEEDS to stay with you for the first 24 hours after anesthesia.              Please read over the following fact sheets you were given: IF YOU HAVE QUESTIONS ABOUT YOUR PRE-OP INSTRUCTIONS PLEASE CALL [phone]    If you received a COVID test during your pre-op visit  it is requested that you wear a mask when out in public, stay away from anyone that may not be feeling well and notify your surgeon if you develop symptoms. If you test positive for Covid or have been in contact with anyone that  has tested positive in the last 10 days please notify you surgeon.    [facility] - Preparing for Surgery Before surgery, you can play an important role.  Because skin is not sterile, your skin needs to be as free of germs as possible.  You can reduce the number of germs on your skin by washing with CHG (chlorahexidine gluconate) soap before surgery.  CHG is an antiseptic cleaner which kills germs and bonds with the skin to continue killing germs even after washing. Please DO NOT use if you have an allergy to CHG or antibacterial soaps.  If your skin becomes reddened/irritated stop using the CHG and inform your nurse when you arrive at Short Stay. Do not shave (including legs and underarms) for at least 48 hours prior to the first CHG shower.  You may shave your face/neck.  Please follow these instructions carefully:  1.  Shower with CHG Soap the night before surgery and the  morning of surgery.  2.  If you choose to wash your hair, wash your hair first as usual with your normal  shampoo.  3.  After you shampoo, rinse your hair  and body thoroughly to remove the shampoo.                             4.  Use CHG as you would any other liquid soap.  You can apply chg directly to the skin and wash.  Gently with a scrungie or clean washcloth.  5.  Apply the CHG Soap to your body ONLY FROM THE NECK DOWN.   Do   not use on face/ open                           Wound or open sores. Avoid contact with eyes, ears mouth and   genitals (private parts).                       Wash face,  Genitals (private parts) with your normal soap.             6.  Wash thoroughly, paying special attention to the area where your    surgery  will be performed.  7.  Thoroughly rinse your body with warm water from the neck down.  8.  DO NOT shower/wash with your normal soap after using and rinsing off the CHG Soap.                9.  Pat yourself dry with a clean towel.            10.  Wear clean pajamas.            11.  Place clean sheets on your bed the night of your first shower and do not  sleep with pets. Day of Surgery : Do not apply any lotions/deodorants the morning of surgery.  Please wear clean clothes to the hospital/surgery center.  FAILURE TO FOLLOW THESE INSTRUCTIONS MAY RESULT IN THE CANCELLATION OF YOUR SURGERY  PATIENT SIGNATURE_________________________________  NURSE SIGNATURE__________________________________  ________________________________________________________________________

## 2023-07-29 NOTE — Progress Notes (Signed)
 Please place orders for PAT appointment scheduled 07/30/23.

## 2023-07-29 NOTE — Progress Notes (Addendum)
 Patient has cochlear implants and needs hearing aide for communication  COVID Vaccine Completed: yes  Date of COVID positive in last 90 days: no  PCP - Cheryle Frees, MD Cardiologist - n/a  Chest x-ray - n/a EKG - n/a Stress Test - n/a ECHO - n/a Cardiac Cath - n/a Pacemaker/ICD device last checked: n/a Spinal Cord Stimulator: n/a  Bowel Prep - no  Sleep Study - n/a CPAP -   Fasting Blood Sugar - n/a Checks Blood Sugar _____ times a day  Last dose of GLP1 agonist-  N/A GLP1 instructions:  Hold 7 days before surgery    Last dose of SGLT-2 inhibitors-  N/A SGLT-2 instructions:  Hold 3 days before surgery    Blood Thinner Instructions: n/a Aspirin Instructions: Last Dose:  Activity level: Can go up a flight of stairs and perform activities of daily living without stopping and without symptoms of chest pain or shortness of breath.   Anesthesia review:   Patient denies shortness of breath, fever, cough and chest pain at PAT appointment  Patient verbalized understanding of instructions that were given to them at the PAT appointment. Patient was also instructed that they will need to review over the PAT instructions again at home before surgery.

## 2023-07-30 ENCOUNTER — Encounter (HOSPITAL_COMMUNITY)
Admission: RE | Admit: 2023-07-30 | Discharge: 2023-07-30 | Disposition: A | Discharge status: Home / Self Care | Payer: 59 | Source: Ambulatory Visit | Attending: Urology | Admitting: Urology

## 2023-07-30 ENCOUNTER — Encounter (HOSPITAL_COMMUNITY): Payer: Self-pay

## 2023-07-30 ENCOUNTER — Other Ambulatory Visit: Payer: Self-pay

## 2023-07-30 VITALS — BP 129/102 | HR 81 | Temp 98.4°F | Resp 16

## 2023-07-30 DIAGNOSIS — Z01812 Encounter for preprocedural laboratory examination: Secondary | ICD-10-CM | POA: Insufficient documentation

## 2023-07-30 DIAGNOSIS — Z01818 Encounter for other preprocedural examination: Secondary | ICD-10-CM

## 2023-07-30 HISTORY — DX: Burn of unspecified body region, unspecified degree: T30.0

## 2023-07-30 HISTORY — DX: Personal history of urinary calculi: Z87.442

## 2023-07-30 LAB — CBC
HCT: 52.5 % — ABNORMAL HIGH (ref 39.0–52.0)
Hemoglobin: 17.8 g/dL — ABNORMAL HIGH (ref 13.0–17.0)
MCH: 31.1 pg (ref 26.0–34.0)
MCHC: 33.9 g/dL (ref 30.0–36.0)
MCV: 91.6 fL (ref 80.0–100.0)
Platelets: 225 10*3/uL (ref 150–400)
RBC: 5.73 MIL/uL (ref 4.22–5.81)
RDW: 12.8 % (ref 11.5–15.5)
WBC: 9 10*3/uL (ref 4.0–10.5)
nRBC: 0 % (ref 0.0–0.2)

## 2023-08-02 ENCOUNTER — Encounter (HOSPITAL_COMMUNITY)
Admission: RE | Disposition: A | Discharge status: Home / Self Care | Payer: Self-pay | Source: Ambulatory Visit | Attending: Urology

## 2023-08-02 ENCOUNTER — Ambulatory Visit (HOSPITAL_BASED_OUTPATIENT_CLINIC_OR_DEPARTMENT_OTHER): Payer: 59 | Admitting: Anesthesiology

## 2023-08-02 ENCOUNTER — Ambulatory Visit (HOSPITAL_COMMUNITY)
Admission: RE | Admit: 2023-08-02 | Discharge: 2023-08-02 | Disposition: A | Discharge status: Home / Self Care | Payer: 59 | Source: Ambulatory Visit | Attending: Urology | Admitting: Urology

## 2023-08-02 ENCOUNTER — Encounter (HOSPITAL_COMMUNITY): Payer: Self-pay | Admitting: Urology

## 2023-08-02 ENCOUNTER — Ambulatory Visit (HOSPITAL_COMMUNITY): Payer: 59 | Admitting: Anesthesiology

## 2023-08-02 ENCOUNTER — Ambulatory Visit (HOSPITAL_COMMUNITY): Payer: 59

## 2023-08-02 ENCOUNTER — Other Ambulatory Visit: Payer: Self-pay

## 2023-08-02 DIAGNOSIS — N200 Calculus of kidney: Secondary | ICD-10-CM | POA: Diagnosis not present

## 2023-08-02 DIAGNOSIS — Z905 Acquired absence of kidney: Secondary | ICD-10-CM | POA: Diagnosis not present

## 2023-08-02 DIAGNOSIS — N319 Neuromuscular dysfunction of bladder, unspecified: Secondary | ICD-10-CM | POA: Diagnosis not present

## 2023-08-02 HISTORY — PX: CYSTOSCOPY/URETEROSCOPY/HOLMIUM LASER/STENT PLACEMENT: SHX6546

## 2023-08-02 LAB — BASIC METABOLIC PANEL
Anion gap: 11 (ref 5–15)
BUN: 18 mg/dL (ref 6–20)
CO2: 21 mmol/L — ABNORMAL LOW (ref 22–32)
Calcium: 9.5 mg/dL (ref 8.9–10.3)
Chloride: 108 mmol/L (ref 98–111)
Creatinine, Ser: 1.09 mg/dL (ref 0.61–1.24)
GFR, Estimated: >60 mL/min (ref >60)
Glucose, Bld: 109 mg/dL — ABNORMAL HIGH (ref 70–99)
Potassium: 3.8 mmol/L (ref 3.5–5.1)
Sodium: 140 mmol/L (ref 135–145)

## 2023-08-02 SURGERY — CYSTOSCOPY/URETEROSCOPY/HOLMIUM LASER/STENT PLACEMENT
Anesthesia: General | Laterality: Left

## 2023-08-02 MED ORDER — SENNOSIDES-DOCUSATE SODIUM 8.6-50 MG PO TABS: 1.0000 | ORAL_TABLET | Freq: Two times a day (BID) | ORAL | 0 refills | Status: DC

## 2023-08-02 MED ORDER — ACETAMINOPHEN 10 MG/ML IV SOLN: 1000.0000 mg | Freq: Once | INTRAVENOUS | Status: DC | PRN

## 2023-08-02 MED ORDER — FENTANYL CITRATE (PF) 100 MCG/2ML IJ SOLN
INTRAMUSCULAR | Status: AC
  Filled 2023-08-02: qty 2

## 2023-08-02 MED ORDER — IOHEXOL 300 MG/ML  SOLN
INTRAMUSCULAR | Status: DC | PRN
  Administered 2023-08-02: 25 mL

## 2023-08-02 MED ORDER — HYDROCODONE-ACETAMINOPHEN 5-325 MG PO TABS: 1.0000 | ORAL_TABLET | Freq: Four times a day (QID) | ORAL | 0 refills | Status: DC | PRN

## 2023-08-02 MED ORDER — KETOROLAC TROMETHAMINE 30 MG/ML IJ SOLN: 30.0000 mg | Freq: Once | INTRAMUSCULAR | Status: DC | PRN

## 2023-08-02 MED ORDER — GENTAMICIN SULFATE 40 MG/ML IJ SOLN
5.0000 mg/kg | INTRAVENOUS | Status: AC
  Administered 2023-08-02: 377.2 mg via INTRAVENOUS
  Filled 2023-08-02: qty 9.5

## 2023-08-02 MED ORDER — FENTANYL CITRATE (PF) 100 MCG/2ML IJ SOLN
INTRAMUSCULAR | Status: DC | PRN
  Administered 2023-08-02: 100 ug via INTRAVENOUS

## 2023-08-02 MED ORDER — ONDANSETRON HCL 4 MG/2ML IJ SOLN
INTRAMUSCULAR | Status: DC | PRN
  Administered 2023-08-02: 4 mg via INTRAVENOUS

## 2023-08-02 MED ORDER — DEXTROSE 5 % IV SOLN: 5.0000 mg/kg | INTRAVENOUS | Status: DC

## 2023-08-02 MED ORDER — SODIUM CHLORIDE 0.9 % IR SOLN
Status: DC | PRN
  Administered 2023-08-02 (×2): 3000 mL via INTRAVESICAL

## 2023-08-02 MED ORDER — MIDAZOLAM HCL 5 MG/5ML IJ SOLN
INTRAMUSCULAR | Status: DC | PRN
  Administered 2023-08-02: 2 mg via INTRAVENOUS

## 2023-08-02 MED ORDER — DEXAMETHASONE SODIUM PHOSPHATE 4 MG/ML IJ SOLN
INTRAMUSCULAR | Status: DC | PRN
  Administered 2023-08-02: 8 mg via INTRAVENOUS

## 2023-08-02 MED ORDER — OXYCODONE HCL 5 MG PO TABS: 5.0000 mg | ORAL_TABLET | Freq: Once | ORAL | Status: DC | PRN

## 2023-08-02 MED ORDER — ORAL CARE MOUTH RINSE: 15.0000 mL | Freq: Once | OROMUCOSAL | Status: AC

## 2023-08-02 MED ORDER — LACTATED RINGERS IV SOLN: INTRAVENOUS | Status: DC

## 2023-08-02 MED ORDER — 0.9 % SODIUM CHLORIDE (POUR BTL) OPTIME
TOPICAL | Status: DC | PRN
  Administered 2023-08-02: 1000 mL

## 2023-08-02 MED ORDER — PROPOFOL 10 MG/ML IV BOLUS
INTRAVENOUS | Status: AC
  Filled 2023-08-02: qty 20

## 2023-08-02 MED ORDER — FENTANYL CITRATE PF 50 MCG/ML IJ SOSY: 25.0000 ug | PREFILLED_SYRINGE | INTRAMUSCULAR | Status: DC | PRN

## 2023-08-02 MED ORDER — EPHEDRINE SULFATE (PRESSORS) 50 MG/ML IJ SOLN
INTRAMUSCULAR | Status: DC | PRN
  Administered 2023-08-02: 10 mg via INTRAVENOUS

## 2023-08-02 MED ORDER — OXYCODONE HCL 5 MG/5ML PO SOLN: 5.0000 mg | Freq: Once | ORAL | Status: DC | PRN

## 2023-08-02 MED ORDER — PROPOFOL 10 MG/ML IV BOLUS
INTRAVENOUS | Status: DC | PRN
  Administered 2023-08-02: 200 mg via INTRAVENOUS

## 2023-08-02 MED ORDER — MIDAZOLAM HCL 2 MG/2ML IJ SOLN
INTRAMUSCULAR | Status: AC
Start: 2023-08-02 — End: ?
  Filled 2023-08-02: qty 2

## 2023-08-02 MED ORDER — AMISULPRIDE (ANTIEMETIC) 5 MG/2ML IV SOLN: 10.0000 mg | Freq: Once | INTRAVENOUS | Status: DC | PRN

## 2023-08-02 MED ORDER — CHLORHEXIDINE GLUCONATE 0.12 % MT SOLN
15.0000 mL | Freq: Once | OROMUCOSAL | Status: AC
  Administered 2023-08-02: 15 mL via OROMUCOSAL

## 2023-08-02 SURGICAL SUPPLY — 22 items
BAG URO CATCHER STRL LF (MISCELLANEOUS) ×1 IMPLANT
BASKET LASER NITINOL 1.9FR (BASKET) IMPLANT
CATH URETL OPEN END 6FR 70 (CATHETERS) ×1 IMPLANT
CLOTH BEACON ORANGE TIMEOUT ST (SAFETY) ×1 IMPLANT
EXTRACTOR STONE 1.7FRX115CM (UROLOGICAL SUPPLIES) IMPLANT
GLOVE SURG LX STRL 7.5 STRW (GLOVE) ×1 IMPLANT
GOWN STRL REUS W/ TWL XL LVL3 (GOWN DISPOSABLE) ×1 IMPLANT
GUIDEWIRE ANG ZIPWIRE 038X150 (WIRE) ×1 IMPLANT
GUIDEWIRE STR DUAL SENSOR (WIRE) ×1 IMPLANT
KIT TURNOVER KIT A (KITS) IMPLANT
LASER FIB FLEXIVA PULSE ID 365 (Laser) IMPLANT
MANIFOLD NEPTUNE II (INSTRUMENTS) ×1 IMPLANT
PACK CYSTO (CUSTOM PROCEDURE TRAY) ×1 IMPLANT
SHEATH NAVIGATOR HD 11/13X28 (SHEATH) IMPLANT
SHEATH NAVIGATOR HD 11/13X36 (SHEATH) IMPLANT
STENT POLARIS 5FRX24 (STENTS) IMPLANT
SYR 50ML LL SCALE MARK (SYRINGE) IMPLANT
TRACTIP FLEXIVA PULS ID 200XHI (Laser) IMPLANT
TRACTIP FLEXIVA PULSE ID 200 (Laser) IMPLANT
TUBE PU 8FR 16IN ENFIT (TUBING) ×1 IMPLANT
TUBING CONNECTING 10 (TUBING) ×1 IMPLANT
TUBING UROLOGY SET (TUBING) ×1 IMPLANT

## 2023-08-02 NOTE — Transfer of Care (Signed)
Immediate Anesthesia Transfer of Care Note  Patient: Philip Stark  Procedure(s) Performed: FIRST STAGE CYSTOSCOPY/LEFT RETROGRADE PYELOGRAM/URETEROSCOPY/HOLMIUM LASER/STENT PLACEMENT (Left)  Patient Location: PACU  Anesthesia Type:General  Level of Consciousness: awake  Airway & Oxygen Therapy: Patient Spontanous Breathing  Post-op Assessment: Report given to RN and Post -op Vital signs reviewed and stable  Post vital signs: Reviewed and stable  Last Vitals:  Vitals Value Taken Time  BP 135/93 08/02/23 1500  Temp    Pulse 82 08/02/23 1502  Resp 10 08/02/23 1502  SpO2 100 % 08/02/23 1502  Vitals shown include unfiled device data.  Last Pain:  Vitals:   08/02/23 1153  TempSrc:   PainSc: 0-No pain         Complications: No notable events documented.
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
## 2023-08-02 NOTE — Anesthesia Postprocedure Evaluation (Signed)
Anesthesia Post Note  Patient: PRIMUS MCNAMARA  Procedure(s) Performed: FIRST STAGE CYSTOSCOPY/LEFT RETROGRADE PYELOGRAM/URETEROSCOPY/HOLMIUM LASER/STENT PLACEMENT (Left)     Patient location during evaluation: PACU Anesthesia Type: General Level of consciousness: awake Pain management: pain level controlled Vital Signs Assessment: post-procedure vital signs reviewed and stable Respiratory status: spontaneous breathing, nonlabored ventilation and respiratory function stable Cardiovascular status: blood pressure returned to baseline and stable Postop Assessment: no apparent nausea or vomiting Anesthetic complications: no   No notable events documented.  Last Vitals:  Vitals:   08/02/23 1530 08/02/23 1545  BP: (!) 134/91 (!) 140/95  Pulse: 66 75  Resp: 11 11  Temp:  (!) 36.3 C  SpO2: 98% 98%    Last Pain:  Vitals:   08/02/23 1545  TempSrc:   PainSc: 0-No pain                 Nazario Russom P Mukesh Kornegay

## 2023-08-02 NOTE — Brief Op Note (Signed)
08/02/2023  2:50 PM  PATIENT:  Philip Stark  58 y.o. male  PRE-OPERATIVE DIAGNOSIS:  LEFT RENAL STONES  POST-OPERATIVE DIAGNOSIS:  LEFT RENAL STONES  PROCEDURE:  Procedure(s) with comments: FIRST STAGE CYSTOSCOPY/LEFT RETROGRADE PYELOGRAM/URETEROSCOPY/HOLMIUM LASER/STENT PLACEMENT (Left) - 90 MINUTES NEEDED FOR CASE  SURGEON:  Surgeons and Role:    * Dillin Lofgren, Delbert Phenix., MD - Primary  PHYSICIAN ASSISTANT:   ASSISTANTS: none   ANESTHESIA:   general  EBL:  5 mL   BLOOD ADMINISTERED:none  DRAINS: none   LOCAL MEDICATIONS USED:  NONE  SPECIMEN:  No Specimen  DISPOSITION OF SPECIMEN:  N/A  COUNTS:  YES  TOURNIQUET:  * No tourniquets in log *  DICTATION: .Other Dictation: Dictation Number [id number]  PLAN OF CARE: Discharge to home after PACU  PATIENT DISPOSITION:  PACU - hemodynamically stable.   Delay start of Pharmacological VTE agent (>24hrs) due to surgical blood loss or risk of bleeding: yes

## 2023-08-02 NOTE — Discharge Instructions (Signed)
1 - You may have urinary urgency (bladder spasms) and bloody urine on / off with stent in place. This is normal. ° °2 - Call MD or go to ER for fever >102, severe pain / nausea / vomiting not relieved by medications, or acute change in medical status ° °
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
## 2023-08-02 NOTE — Anesthesia Procedure Notes (Signed)
Procedure Name: Intubation Date/Time: 08/02/2023 1:42 PM  Performed by: Carloyn Manner, CRNAPre-anesthesia Checklist: Patient identified, Emergency Drugs available, Suction available, Patient being monitored and Timeout performed Patient Re-evaluated:Patient Re-evaluated prior to induction Oxygen Delivery Method: Circle system utilized Preoxygenation: Pre-oxygenation with 100% oxygen Induction Type: IV induction Ventilation: Mask ventilation without difficulty LMA: LMA with gastric port inserted LMA Size: 4.0 Tube type: Oral Number of attempts: 1 Placement Confirmation: positive ETCO2 and breath sounds checked- equal and bilateral Tube secured with: Tape Dental Injury: Teeth and Oropharynx as per pre-operative assessment

## 2023-08-02 NOTE — Op Note (Unsigned)
NAMEVADEN, MATLICK MEDICAL RECORD NO: [id number] ACCOUNT NO: [id number] DATE OF BIRTH: 1966/06/23 FACILITY: [facility] LOCATION: [facility] PHYSICIAN: Sebastian Ache, MD  Operative Report   DATE OF PROCEDURE: 08/02/2023  PREOPERATIVE DIAGNOSIS: Left partial staghorn kidney stone, solitary kidney.  PROCEDURE PERFORMED: 1.  Cystoscopy, left retrograde pyelogram with interpretation. 2.  Left ureteroscopy with first-stage laser lithotripsy. 3.  Insertion of left ureteral stent.  ESTIMATED BLOOD LOSS:  Nil.  COMPLICATIONS:  None.  SPECIMENS:  None.  FINDINGS: 1.  Relatively tortuous left ureter, minimal intrarenal obstruction. 2.  Very soft lower pole partial staghorn kidney stone consistent with the patient's known history of struvite stones. 3.  Dusting and fragmentation of estimated 80% to 90% total stone volume. 4.  Successful placement of left ureteral stent, proximal end in the upper pole, distal end in the urinary bladder.  INDICATIONS: The patient is a pleasant 58 year old man with a history of developmental delay but who has excellent functional baseline status and excellent family support.  He has a solitary left kidney, with his right kidney having been removed as it was atrophic  and full of stone previously.  He has been on surveillance of his solitary kidney, which is known to have some lower pole stone after a percutaneous procedure done elsewhere.  He is compliant surveillance imaging, had a significant progression with his lower  pole stone now having essentially a partial staghorn orientation with no obvious hydronephrosis at this point.  After review of the risks and benefits, we have recommended a path of staged ureteroscopy with the goal of maximal protection of the left  kidney addressing his large stone.  He presents for the first stage today.  DESCRIPTION OF PROCEDURE: The patient being Treaver Jacquot verified and the procedure being left first stage  ureteroscopy with stone ablation was confirmed.  Procedure timeout was performed.  Intravenous antibiotics administered.  General LMA anesthesia induced.  The patient was  placed into a low lithotomy position.  Sterile field was created by prepping and draping the patient's penis, perineum, and proximal thighs.  Cystourethroscopy performed using a 21-French rigid cystoscope with offset lens.  Inspection of the anterior  and posterior urethra was unremarkable.  Examination of the urinary bladder revealed no diverticula, calcifications, or pallor lesions.  The left ureteral orifice was somewhat lateral and superiorly displaced. It was cannulated with a 6-French open ended  catheter and left retrograde pyelogram was obtained.  Left retrograde pyelogram demonstrated a single left ureter, single system left kidney.  There was significant tortuosity.  The angled tip zip wire was then carefully advanced to the level of the renal pelvis and set aside as a safety wire.  An 8-French  feeding tube placed in the urinary bladder for its release. A semi-rigid ureteroscopy was performed distal to two-thirds of the left ureter alongside a separate sensor working wire.  No mucosal abnormalities or stones were known or found.  The semi-rigid  ureteroscope was then exchanged for a medium length ureteral access sheath to the level of the mid-ureter.  Using continuous fluoroscopic guidance and flexible digital ureteroscopy was performed of the proximal left ureter and systematic inspection of  left kidney. As anticipated, there was a large lower pole partial staghorn stone. There was no obvious obstruction.  This was occupying essentially one-fourth to one-fifth the total volume of the intrarenal kidney.  The stone was quite white in color and  very soft in nature as anticipated, consistent with his struvite stone history.  Next, Holmium  laser energy was applied to this in the setting of 0.2 joules and 20 Hz.  Using a dusting  technique, Holmium energy was applied to the stone for approximately 70 minutes, at which point visualization became quite poor inherently with the  degeneration of innumerable stone fragments. It was estimated that we had ablated approximately 80% or more of the total stone volume.  We had cleared out the renal pelvis, lower pole, lower mid infundibulum, and lower infundibulum as well. I was quite  happy with the progress that we made today.  Sequential irrigation technique was used to remove dusty fragments.  I felt that we should conclude the first stage procedure today. Hopeful clearance of majority of fragments and then proceed with a second  stage procedure in several weeks as planned.  As such, the access sheath was removed under continuous vision. No significant mucosal abnormalities are found.  A new 5 x 24 Polaris type stent was carefully placed over the remaining safety wires using  fluoroscopic guidance.  Good proximal and distal plane were noted.  The procedure was terminated.  The patient tolerated procedure well, no immediate periprocedural complications.  The patient taken to postanesthesia care unit in stable condition.   MUK D: 08/02/2023 2:57:34 pm T: 08/02/2023 8:41:00 pm  JOB: [id number]/ [id number]

## 2023-08-03 ENCOUNTER — Encounter (HOSPITAL_COMMUNITY): Payer: Self-pay | Admitting: Urology

## 2023-08-06 DIAGNOSIS — R8271 Bacteriuria: Secondary | ICD-10-CM | POA: Diagnosis not present

## 2023-08-06 DIAGNOSIS — N200 Calculus of kidney: Secondary | ICD-10-CM | POA: Diagnosis not present

## 2023-08-08 NOTE — Progress Notes (Signed)
Patient phoned to give updated information on surgery.  Date of Surgery - 08-16-23  Arrival Time - 10:15 and check in at admitting.    NPO Status - patient's mother reminded that patient cannot eat or drink anything after midnight the night before surgery.   Medications morning of surgery - Hydrocodone if needed  No change in medical history, allergies per patient's mother.    Transportation home - patient's mother, Aimar Loflin.  Ph# [phone]  All questions answered and patient stated understanding

## 2023-08-15 ENCOUNTER — Other Ambulatory Visit: Payer: Self-pay | Admitting: Urology

## 2023-08-15 ENCOUNTER — Encounter (HOSPITAL_COMMUNITY): Payer: Self-pay | Admitting: Urology

## 2023-08-15 NOTE — Progress Notes (Signed)
   08/15/23 5784  Preop Orders  Has preop orders? No  Name of staff/physician contacted for orders(Indicate phone or IB message) Left Message for surgery scheduler  Name of OR scheduler notified Zoina   Evern Bio BSN, Radio producer - [facility] [place] [phone]

## 2023-08-16 ENCOUNTER — Ambulatory Visit (HOSPITAL_COMMUNITY)
Admission: RE | Admit: 2023-08-16 | Discharge: 2023-08-16 | Disposition: A | Discharge status: Home / Self Care | Payer: 59 | Source: Ambulatory Visit | Attending: Urology | Admitting: Urology

## 2023-08-16 ENCOUNTER — Ambulatory Visit (HOSPITAL_COMMUNITY): Payer: 59

## 2023-08-16 ENCOUNTER — Encounter (HOSPITAL_COMMUNITY): Payer: Self-pay | Admitting: Urology

## 2023-08-16 ENCOUNTER — Ambulatory Visit (HOSPITAL_COMMUNITY): Payer: 59 | Admitting: Anesthesiology

## 2023-08-16 ENCOUNTER — Ambulatory Visit (HOSPITAL_BASED_OUTPATIENT_CLINIC_OR_DEPARTMENT_OTHER): Payer: 59 | Admitting: Anesthesiology

## 2023-08-16 ENCOUNTER — Encounter (HOSPITAL_COMMUNITY)
Admission: RE | Disposition: A | Discharge status: Home / Self Care | Payer: Self-pay | Source: Ambulatory Visit | Attending: Urology

## 2023-08-16 ENCOUNTER — Other Ambulatory Visit: Payer: Self-pay

## 2023-08-16 DIAGNOSIS — Z905 Acquired absence of kidney: Secondary | ICD-10-CM | POA: Insufficient documentation

## 2023-08-16 DIAGNOSIS — N200 Calculus of kidney: Secondary | ICD-10-CM | POA: Insufficient documentation

## 2023-08-16 DIAGNOSIS — Z01818 Encounter for other preprocedural examination: Secondary | ICD-10-CM

## 2023-08-16 DIAGNOSIS — R6250 Unspecified lack of expected normal physiological development in childhood: Secondary | ICD-10-CM | POA: Diagnosis not present

## 2023-08-16 HISTORY — PX: CYSTOSCOPY/URETEROSCOPY/HOLMIUM LASER/STENT PLACEMENT: SHX6546

## 2023-08-16 SURGERY — CYSTOSCOPY/URETEROSCOPY/HOLMIUM LASER/STENT PLACEMENT
Anesthesia: General | Laterality: Left

## 2023-08-16 MED ORDER — EPHEDRINE SULFATE (PRESSORS) 50 MG/ML IJ SOLN
INTRAMUSCULAR | Status: DC | PRN
  Administered 2023-08-16 (×2): 5 mg via INTRAVENOUS
  Administered 2023-08-16: 7.5 mg via INTRAVENOUS

## 2023-08-16 MED ORDER — LACTATED RINGERS IV SOLN: INTRAVENOUS | Status: DC

## 2023-08-16 MED ORDER — FENTANYL CITRATE (PF) 100 MCG/2ML IJ SOLN
INTRAMUSCULAR | Status: AC
  Filled 2023-08-16: qty 2

## 2023-08-16 MED ORDER — NITROFURANTOIN MONOHYD MACRO 100 MG PO CAPS: 100.0000 mg | ORAL_CAPSULE | Freq: Two times a day (BID) | ORAL | 0 refills | Status: DC

## 2023-08-16 MED ORDER — HYDROCODONE-ACETAMINOPHEN 5-325 MG PO TABS: 1.0000 | ORAL_TABLET | Freq: Four times a day (QID) | ORAL | 0 refills | Status: DC | PRN

## 2023-08-16 MED ORDER — GENTAMICIN SULFATE 40 MG/ML IJ SOLN
5.0000 mg/kg | INTRAVENOUS | Status: AC
Start: 2023-08-16 — End: 2023-08-16
  Administered 2023-08-16: 380 mg via INTRAVENOUS
  Filled 2023-08-16: qty 9.5

## 2023-08-16 MED ORDER — ONDANSETRON HCL 4 MG/2ML IJ SOLN
INTRAMUSCULAR | Status: DC | PRN
  Administered 2023-08-16: 4 mg via INTRAVENOUS

## 2023-08-16 MED ORDER — CHLORHEXIDINE GLUCONATE 0.12 % MT SOLN
15.0000 mL | Freq: Once | OROMUCOSAL | Status: AC
  Administered 2023-08-16: 15 mL via OROMUCOSAL

## 2023-08-16 MED ORDER — MIDAZOLAM HCL 2 MG/2ML IJ SOLN
INTRAMUSCULAR | Status: AC
  Filled 2023-08-16: qty 2

## 2023-08-16 MED ORDER — EPHEDRINE 5 MG/ML INJ
INTRAVENOUS | Status: AC
  Filled 2023-08-16: qty 5

## 2023-08-16 MED ORDER — DEXAMETHASONE SODIUM PHOSPHATE 4 MG/ML IJ SOLN
INTRAMUSCULAR | Status: DC | PRN
  Administered 2023-08-16: 5 mg via INTRAVENOUS

## 2023-08-16 MED ORDER — IOHEXOL 300 MG/ML  SOLN
INTRAMUSCULAR | Status: DC | PRN
  Administered 2023-08-16: 19 mL

## 2023-08-16 MED ORDER — ORAL CARE MOUTH RINSE: 15.0000 mL | Freq: Once | OROMUCOSAL | Status: AC

## 2023-08-16 MED ORDER — LIDOCAINE HCL (CARDIAC) PF 100 MG/5ML IV SOSY
PREFILLED_SYRINGE | INTRAVENOUS | Status: DC | PRN
  Administered 2023-08-16: 50 mg via INTRAVENOUS

## 2023-08-16 MED ORDER — DROPERIDOL 2.5 MG/ML IJ SOLN: 0.6250 mg | Freq: Once | INTRAMUSCULAR | Status: DC | PRN

## 2023-08-16 MED ORDER — PROPOFOL 10 MG/ML IV BOLUS
INTRAVENOUS | Status: DC | PRN
  Administered 2023-08-16: 180 mg via INTRAVENOUS

## 2023-08-16 MED ORDER — SODIUM CHLORIDE 0.9 % IR SOLN
Status: DC | PRN
  Administered 2023-08-16: 3000 mL

## 2023-08-16 MED ORDER — ACETAMINOPHEN 500 MG PO TABS
1000.0000 mg | ORAL_TABLET | Freq: Once | ORAL | Status: AC
  Administered 2023-08-16: 1000 mg via ORAL
  Filled 2023-08-16: qty 2

## 2023-08-16 MED ORDER — FENTANYL CITRATE (PF) 100 MCG/2ML IJ SOLN
INTRAMUSCULAR | Status: DC | PRN
  Administered 2023-08-16: 25 ug via INTRAVENOUS
  Administered 2023-08-16: 50 ug via INTRAVENOUS
  Administered 2023-08-16: 25 ug via INTRAVENOUS

## 2023-08-16 MED ORDER — FENTANYL CITRATE PF 50 MCG/ML IJ SOSY
25.0000 ug | PREFILLED_SYRINGE | INTRAMUSCULAR | Status: DC | PRN
  Administered 2023-08-16: 50 ug via INTRAVENOUS

## 2023-08-16 MED ORDER — MIDAZOLAM HCL 5 MG/5ML IJ SOLN
INTRAMUSCULAR | Status: DC | PRN
  Administered 2023-08-16: 2 mg via INTRAVENOUS

## 2023-08-16 MED ORDER — FENTANYL CITRATE PF 50 MCG/ML IJ SOSY
PREFILLED_SYRINGE | INTRAMUSCULAR | Status: AC
  Filled 2023-08-16: qty 1

## 2023-08-16 SURGICAL SUPPLY — 21 items
BAG URO CATCHER STRL LF (MISCELLANEOUS) ×1 IMPLANT
BASKET LASER NITINOL 1.9FR (BASKET) IMPLANT
BASKET STONE NCOMPASS (UROLOGICAL SUPPLIES) IMPLANT
CATH URETL OPEN END 6FR 70 (CATHETERS) ×1 IMPLANT
CLOTH BEACON ORANGE TIMEOUT ST (SAFETY) ×1 IMPLANT
EXTRACTOR STONE 1.7FRX115CM (UROLOGICAL SUPPLIES) IMPLANT
GLOVE SURG LX STRL 7.5 STRW (GLOVE) ×1 IMPLANT
GOWN STRL REUS W/ TWL XL LVL3 (GOWN DISPOSABLE) ×1 IMPLANT
GUIDEWIRE ANG ZIPWIRE 038X150 (WIRE) ×1 IMPLANT
GUIDEWIRE STR DUAL SENSOR (WIRE) ×1 IMPLANT
KIT TURNOVER KIT A (KITS) IMPLANT
LASER FIB FLEXIVA PULSE ID 365 (Laser) IMPLANT
MANIFOLD NEPTUNE II (INSTRUMENTS) ×1 IMPLANT
PACK CYSTO (CUSTOM PROCEDURE TRAY) ×1 IMPLANT
SHEATH NAVIGATOR HD 11/13X28 (SHEATH) IMPLANT
SHEATH NAVIGATOR HD 11/13X36 (SHEATH) IMPLANT
STENT POLARIS 5FRX26 (STENTS) IMPLANT
TRACTIP FLEXIVA PULS ID 200XHI (Laser) IMPLANT
TUBE PU 8FR 16IN ENFIT (TUBING) ×1 IMPLANT
TUBING CONNECTING 10 (TUBING) ×1 IMPLANT
TUBING UROLOGY SET (TUBING) ×1 IMPLANT
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
## 2023-08-16 NOTE — Transfer of Care (Signed)
Immediate Anesthesia Transfer of Care Note  Patient: Philip Stark  Procedure(s) Performed: Procedure(s) with comments: SECOND STAGE CYSTOSCOPY/LEFT RETROGRADE PYELOGRAM/URETEROSCOPY/HOLMIUM LASER/STENT PLACEMENT (Left) - 90 MINUTES NEEDED FOR CASE  Patient Location: PACU  Anesthesia Type:General  Level of Consciousness:  sedated, patient cooperative and responds to stimulation  Airway & Oxygen Therapy:Patient Spontanous Breathing and Patient connected to face mask oxgen  Post-op Assessment:  Report given to PACU RN and Post -op Vital signs reviewed and stable  Post vital signs:  Reviewed and stable  Last Vitals:  Vitals:   08/16/23 0944 08/16/23 1313  BP:  127/81  Pulse: (P) 74 73  Resp: (P) 13 12  Temp: (P) 36.6 C   SpO2: (P) 96% 100%    Complications: No apparent anesthesia complications

## 2023-08-16 NOTE — Anesthesia Procedure Notes (Signed)
Procedure Name: LMA Insertion Date/Time: 08/16/2023 12:07 PM  Performed by: Theodosia Quay, CRNAPre-anesthesia Checklist: Patient identified, Emergency Drugs available, Suction available, Patient being monitored and Timeout performed Patient Re-evaluated:Patient Re-evaluated prior to induction Oxygen Delivery Method: Circle system utilized Preoxygenation: Pre-oxygenation with 100% oxygen Induction Type: IV induction Ventilation: Mask ventilation without difficulty LMA: LMA inserted LMA Size: 4.0 Tube size: 4.0 mm Number of attempts: 1 Placement Confirmation: positive ETCO2 and breath sounds checked- equal and bilateral Tube secured with: Tape Dental Injury: Teeth and Oropharynx as per pre-operative assessment
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
## 2023-08-16 NOTE — Op Note (Unsigned)
NAMECARLETON, VANVALKENBURGH MEDICAL RECORD NO: [id number] ACCOUNT NO: [id number] DATE OF BIRTH: 06-Aug-1965 FACILITY: [facility] LOCATION: [facility] PHYSICIAN: Sebastian Ache, MD  Operative Report   PREOPERATIVE DIAGNOSES:  Large volume left renal stone, solitary kidney, status post first stage procedure.  PROCEDURE: 1.  Cystoscopy with left retrograde pyelogram interpretation. 2.  Left ureteroscopy with laser lithotripsy, second stage. 3.  Exchange of left ureteral stent.  ESTIMATED BLOOD LOSS: Nil.  COMPLICATIONS:  None.  SPECIMENS:  Left renal stone fragments for composition analysis.  FINDINGS: 1.  Interval passage of vast majority of prior stone fragments from first stage procedure. 2.  Approximately 1.5 cm residual extreme lower pole stone. 3  Complete resolution of all accessible stone fragments larger than one-third mm following laser lithotripsy and basket extraction. 4.  Replacement of left ureteral stent, proximal end in the upper pole and distal end in urinary bladder.  INDICATIONS:  The patient is a very pleasant but unfortunate 58 year old man with a history of developmental delay and severe metabolic stone disease.  He is status post right nephrectomy previously for atrophic kidney and still has significant metabolic  stone disease and has had problems with recurrent stones in his solitary kidney.  He underwent percutaneous procedure at a referring facility several years ago, which he tolerated poorly.  He has been on surveillance of his solitary kidney since then.   He has been known to have a small amount of lower pole stone that on most recent surveillance had progressed into a near partial staghorn orientation occupying approximately the lower ____ of his kidney.  I discussed options with the patient and his  family, including recommended path of staged ureteroscopy, goal of stone free to protect the solitary kidney maximally, and they wished to proceed. He underwent first stage  procedure approximately 2 weeks ago at which point it was estimated that 60% to  70% of stone volume was addressed with interval stenting.  He presents for second stage procedure today.  DESCRIPTION OF PROCEDURE:  The patient being Henrry Feil verified and procedure being left second stage ureteroscopic stone manipulation was confirmed.  Procedure timeout was performed.  Intravenous antibiotics were administered.  General LMA  anesthesia induced.  The patient was placed into a low lithotomy position.  Sterile field was created, prepped and draped the patient's penis, perineum, and proximal thighs using iodine.  Cystourethroscopy performed using 21-French rigid cystoscope with  offset lens.  Inspection of the anterior posterior urethra was unremarkable.  Inspection of the urinary bladder revealed distal end of the left ureteral stent in situ.  It was grasped, brought out in its entirety, and set aside for discard.  Left  ureteral orifice was cannulated with a 6-French open-ended catheter and left retrograde pyelogram was obtained.  Left retrograde pyelogram demonstrated a single left ureter, single system left kidney.  There was significant large caliber tortuosity of the ureter as per baseline.  ZIPwire was advanced to the level of the lower pole and set aside as a safety wire.   An 8-French feeding tube placed in the urinary bladder for pressure release and a semirigid ureteroscopy was performed in distal two-thirds left ureter alongside a separate sensor working wire.  No significant mucosal abnormalities were found and the  ureter was quite tortuous and large caliber.  The semirigid scope was then exchanged for a medium-length ureteral access sheath at the level of the proximal ureter using continuous fluoroscopic guidance and flexible digital ureteroscopy performed of the  proximal left ureter  and systematic inspection of left kidney including all calices x3.  There was significant and quite  successful interval passage of the vast majority of prior stone dust material from his first stage procedure. I was very happy with  this. He did have some relatively small volume residual stone, extreme lower pole and 2 contiguous calyces.  Total stone volume was approximately 1.5 cm.  This remained a very soft phosphate appearance.  Holmium laser energy was then applied to these  foci in settings of 0.2 joules, 20 Hz. Using a dusting technique, approximately 90% of the rest of the stone was ablated.  Several smaller stone fragments were then retrieved using the N[employer]ass basket as were additional areas of stone dust and sludge  given the phosphate nature of the stone. Following this, complete resolution of all accessible stone fragments larger than one-third mm, we achieved the goals of the surgery today.  His kidney actually appears more stone free now than it ever has under  our care.  Given the large nature of the stones in solitary kidney, it was felt that interval stenting with non-tethered stent would be most prudent.  Access sheath was removed under continuous vision.  No significant mucosal abnormalities were found.  A  new 5 x 26  Polaris type stent was carefully placed using fluoroscopic guidance.  Good proximal and distal planes were noted and the procedure was terminated.  The patient tolerated the procedure well.  No immediate periprocedural complications.  The  patient was taken to post anesthesia care unit in stable condition.  Plan for discharge home.   NIK D: 08/16/2023 1:09:32 pm T: 08/16/2023 8:31:00 pm  JOB: [id number]/ [id number]

## 2023-08-16 NOTE — Discharge Instructions (Signed)
1 - You may have urinary urgency (bladder spasms) and bloody urine on / off with stent in place. This is normal. ° °2 - Call MD or go to ER for fever >102, severe pain / nausea / vomiting not relieved by medications, or acute change in medical status ° °

## 2023-08-16 NOTE — Brief Op Note (Signed)
08/16/2023  1:02 PM  PATIENT:  Philip Stark  58 y.o. male  PRE-OPERATIVE DIAGNOSIS:  LEFT RENAL STONES  POST-OPERATIVE DIAGNOSIS:  LEFT RENAL STONES  PROCEDURE:  Procedure(s) with comments: SECOND STAGE CYSTOSCOPY/LEFT RETROGRADE PYELOGRAM/URETEROSCOPY/HOLMIUM LASER/STENT PLACEMENT (Left) - 90 MINUTES NEEDED FOR CASE  SURGEON:  Surgeons and Role:    * Manny, Delbert Phenix., MD - Primary  PHYSICIAN ASSISTANT:   ASSISTANTS: none   ANESTHESIA:   general  EBL:  minimal   BLOOD ADMINISTERED:none  DRAINS: none   LOCAL MEDICATIONS USED:  NONE  SPECIMEN:  Source of Specimen:  left renal stone fragments  DISPOSITION OF SPECIMEN:   [facility] for compositional analysis  COUNTS:  YES  TOURNIQUET:  * No tourniquets in log *  DICTATION: .Other Dictation: Dictation Number [id number]  PLAN OF CARE: Discharge to home after PACU  PATIENT DISPOSITION:  PACU - hemodynamically stable.   Delay start of Pharmacological VTE agent (>24hrs) due to surgical blood loss or risk of bleeding: not applicable

## 2023-08-19 ENCOUNTER — Encounter (HOSPITAL_COMMUNITY): Payer: Self-pay | Admitting: Urology

## 2023-08-20 DIAGNOSIS — N200 Calculus of kidney: Secondary | ICD-10-CM | POA: Diagnosis not present

## 2023-08-20 NOTE — Anesthesia Postprocedure Evaluation (Signed)
Anesthesia Post Note  Patient: Philip Stark  Procedure(s) Performed: SECOND STAGE CYSTOSCOPY/LEFT RETROGRADE PYELOGRAM/URETEROSCOPY/HOLMIUM LASER/STENT PLACEMENT (Left)     Patient location during evaluation: PACU Anesthesia Type: General Level of consciousness: awake and alert Pain management: pain level controlled Vital Signs Assessment: post-procedure vital signs reviewed and stable Respiratory status: spontaneous breathing, nonlabored ventilation, respiratory function stable and patient connected to nasal cannula oxygen Cardiovascular status: blood pressure returned to baseline and stable Postop Assessment: no apparent nausea or vomiting Anesthetic complications: no   No notable events documented.  Last Vitals:  Vitals:   08/16/23 1400 08/16/23 1422  BP: 132/86 (!) 137/95  Pulse: 71 63  Resp: 13   Temp:  (!) 36.3 C  SpO2: 99% 99%    Last Pain:  Vitals:   08/16/23 1422  TempSrc:   PainSc: 2    Pain Goal:                   [name]

## 2023-09-02 DIAGNOSIS — N200 Calculus of kidney: Secondary | ICD-10-CM | POA: Diagnosis not present

## 2023-09-02 DIAGNOSIS — R8271 Bacteriuria: Secondary | ICD-10-CM | POA: Diagnosis not present

## 2023-09-05 ENCOUNTER — Other Ambulatory Visit: Payer: Self-pay | Admitting: Urology

## 2023-09-09 NOTE — Progress Notes (Signed)
 Sent message, via epic in basket, requesting orders in epic from Care[facility] adviser.

## 2023-09-13 DIAGNOSIS — H903 Sensorineural hearing loss, bilateral: Secondary | ICD-10-CM | POA: Diagnosis not present

## 2023-09-15 NOTE — Patient Instructions (Signed)
 SURGICAL WAITING ROOM VISITATION Patients having surgery or a procedure may have no more than 2 support people in the waiting area - these visitors may rotate in the visitor waiting room.   Due to an increase in RSV and influenza rates and associated hospitalizations, children ages 45 and under may not visit patients in [facility] hospitals. If the patient needs to stay at the hospital during part of their recovery, the visitor guidelines for inpatient rooms apply.  PRE-OP VISITATION  Pre-op nurse will coordinate an appropriate time for 1 support person to accompany the patient in pre-op.  This support person may not rotate.  This visitor will be contacted when the time is appropriate for the visitor to come back in the pre-op area.  Please refer to the [facility] website for the visitor guidelines for Inpatients (after your surgery is over and you are in a regular room).  You are not required to quarantine at this time prior to your surgery. However, you must do this: Hand Hygiene often Do NOT share personal items Notify your provider if you are in close contact with someone who has COVID or you develop fever 100.4 or greater, new onset of sneezing, cough, sore throat, shortness of breath or body aches.  If you test positive for Covid or have been in contact with anyone that has tested positive in the last 10 days please notify you surgeon.    Your procedure is scheduled on:  Wednesday  September 18, 2023  Report to [facility] Main Entrance: [address] [facility]sen entrance where the [employer] is available.   Report to admitting at:  2:00 PM  Call this number if you have any questions or problems the morning of surgery [phone]  DO NOT EAT OR DRINK ANYTHING AFTER MIDNIGHT THE NIGHT PRIOR TO YOUR SURGERY / PROCEDURE.   FOLLOW  ANY ADDITIONAL PRE OP INSTRUCTIONS YOU RECEIVED FROM YOUR SURGEON'S OFFICE!!!   Oral Hygiene is also important to reduce your risk of infection.        Remember -  BRUSH YOUR TEETH THE MORNING OF SURGERY WITH YOUR REGULAR TOOTHPASTE  Do NOT smoke after Midnight the night before surgery.  STOP TAKING all Vitamins, Herbs and supplements 1 week before your surgery.   Take ONLY these medicines the morning of surgery with A SIP OF WATER:   none  You may not have any metal on your body including  jewelry, and body piercing  Do not wear  lotions, powders, cologne, or deodorant  Men may shave face and neck.  Contacts, Hearing Aids, dentures or bridgework may not be worn into surgery. DENTURES WILL BE REMOVED PRIOR TO SURGERY PLEASE DO NOT APPLY "Poly grip" OR ADHESIVES!!!  Patients discharged on the day of surgery will not be allowed to drive home.  Someone NEEDS to stay with you for the first 24 hours after anesthesia.  Do not bring your home medications to the hospital. The Pharmacy will dispense medications listed on your medication list to you during your admission in the Hospital.  Please read over the following fact sheets you were given: IF YOU HAVE QUESTIONS ABOUT YOUR PRE-OP INSTRUCTIONS, PLEASE CALL [phone].   [facility] - Preparing for Surgery Before surgery, you can play an important role.  Because skin is not sterile, your skin needs to be as free of germs as possible.  You can reduce the number of germs on your skin by washing with CHG (chlorahexidine gluconate) soap before surgery.  CHG  is an antiseptic cleaner which kills germs and bonds with the skin to continue killing germs even after washing. Please DO NOT use if you have an allergy to CHG or antibacterial soaps.  If your skin becomes reddened/irritated stop using the CHG and inform your nurse when you arrive at Short Stay. Do not shave (including legs and underarms) for at least 48 hours prior to the first CHG shower.  You may shave your face/neck.  Please follow these instructions carefully:  1.  Shower with CHG Soap the night before surgery and the  morning of surgery.  2.  If  you choose to wash your hair, wash your hair first as usual with your normal  shampoo.  3.  After you shampoo, rinse your hair and body thoroughly to remove the shampoo.                             4.  Use CHG as you would any other liquid soap.  You can apply chg directly to the skin and wash.  Gently with a scrungie or clean washcloth.  5.  Apply the CHG Soap to your body ONLY FROM THE NECK DOWN.   Do not use on face/ open                           Wound or open sores. Avoid contact with eyes, ears mouth and genitals (private parts).                       Wash face,  Genitals (private parts) with your normal soap.             6.  Wash thoroughly, paying special attention to the area where your  surgery  will be performed.  7.  Thoroughly rinse your body with warm water from the neck down.  8.  DO NOT shower/wash with your normal soap after using and rinsing off the CHG Soap.            9.  Pat yourself dry with a clean towel.            10.  Wear clean pajamas.            11.  Place clean sheets on your bed the night of your first shower and do not  sleep with pets.  ON THE DAY OF SURGERY : Do not apply any lotions/deodorants the morning of surgery.  Please wear clean clothes to the hospital/surgery center.    FAILURE TO FOLLOW THESE INSTRUCTIONS MAY RESULT IN THE CANCELLATION OF YOUR SURGERY  PATIENT SIGNATURE_________________________________  NURSE SIGNATURE__________________________________  ________________________________________________________________________

## 2023-09-15 NOTE — Progress Notes (Signed)
 COVID Vaccine received:  []  No [x]  Yes Date of any COVID positive Test in last 69 days:None  PCP - Irven Coe, MD  Cardiologist -  none  Chest x-ray -11-20-2020  2v  Epic  EKG -  11-20-2020     Repeated 09-16-23 Stress Test -  ECHO - 05-19-2009  Epic Cardiac Cath -   PCR screen: []  Ordered & Completed []   No Order but Needs PROFEND     [x]   N/A for this surgery  Surgery Plan:  [x]  Ambulatory   []  Outpatient in bed  []  Admit Anesthesia:    [x]  General  []  Spinal  []   Choice []   MAC  Bowel Prep - [x]  No  []   Yes ______  Pacemaker / ICD device [x]  No []  Yes   Spinal Cord Stimulator:[x]  No []  Yes    Has Cochlear implants and must wear his hearing aids at all time.      History of Sleep Apnea? [x]  No []  Yes   CPAP used?- [x]  No []  Yes    Does the patient monitor blood sugar?   [x]  N/A   []  No []  Yes  Patient has: [x]  NO Hx DM   []  Pre-DM   []  DM1  []   DM2  Blood Thinner / Instructions:  none Aspirin Instructions:  none  ERAS Protocol Ordered: [x]  No  []  Yes Patient is to be NPO after: 1400  Dental hx: []  Dentures:  [x]  N/A      []  Bridge or Partial:                   []  Loose or Damaged teeth:   Activity level: Can go up a flight of stairs and perform activities of daily living without stopping and without symptoms of chest pain or shortness of breath.          Patient's mother, Aemon Koeller, is his POA.   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   Patient denies shortness of breath, fever, cough and chest pain at PAT appointment.  Patient verbalized understanding and agreement to the Pre-Surgical Instructions that were given to them at this PAT appointment. Patient was also educated of the need to review these PAT instructions again prior to his surgery.I reviewed the appropriate phone numbers to call if they have any and  questions or concerns.

## 2023-09-16 ENCOUNTER — Other Ambulatory Visit: Payer: Self-pay

## 2023-09-16 ENCOUNTER — Encounter (HOSPITAL_COMMUNITY): Payer: Self-pay

## 2023-09-16 ENCOUNTER — Encounter (HOSPITAL_COMMUNITY)
Admission: RE | Admit: 2023-09-16 | Discharge: 2023-09-16 | Disposition: A | Discharge status: Home / Self Care | Payer: 59 | Source: Ambulatory Visit | Attending: Urology | Admitting: Urology

## 2023-09-16 VITALS — BP 128/90 | HR 78 | Temp 98.1°F | Resp 16 | Ht 68.0 in | Wt 163.0 lb

## 2023-09-16 DIAGNOSIS — Z01818 Encounter for other preprocedural examination: Secondary | ICD-10-CM | POA: Diagnosis not present

## 2023-09-16 DIAGNOSIS — G937 Reye's syndrome: Secondary | ICD-10-CM | POA: Insufficient documentation

## 2023-09-16 DIAGNOSIS — Z01810 Encounter for preprocedural cardiovascular examination: Secondary | ICD-10-CM | POA: Diagnosis present

## 2023-09-16 DIAGNOSIS — Z905 Acquired absence of kidney: Secondary | ICD-10-CM | POA: Diagnosis not present

## 2023-09-16 DIAGNOSIS — Z01812 Encounter for preprocedural laboratory examination: Secondary | ICD-10-CM | POA: Diagnosis present

## 2023-09-16 HISTORY — DX: Headache, unspecified: R51.9

## 2023-09-16 LAB — CBC
HCT: 52.6 % — ABNORMAL HIGH (ref 39.0–52.0)
Hemoglobin: 17.9 g/dL — ABNORMAL HIGH (ref 13.0–17.0)
MCH: 30.8 pg (ref 26.0–34.0)
MCHC: 34 g/dL (ref 30.0–36.0)
MCV: 90.4 fL (ref 80.0–100.0)
Platelets: 210 10*3/uL (ref 150–400)
RBC: 5.82 MIL/uL — ABNORMAL HIGH (ref 4.22–5.81)
RDW: 12.8 % (ref 11.5–15.5)
WBC: 9.4 10*3/uL (ref 4.0–10.5)
nRBC: 0 % (ref 0.0–0.2)

## 2023-09-16 LAB — BASIC METABOLIC PANEL
Anion gap: 9 (ref 5–15)
BUN: 17 mg/dL (ref 6–20)
CO2: 23 mmol/L (ref 22–32)
Calcium: 9.4 mg/dL (ref 8.9–10.3)
Chloride: 106 mmol/L (ref 98–111)
Creatinine, Ser: 1.1 mg/dL (ref 0.61–1.24)
GFR, Estimated: >60 mL/min (ref >60)
Glucose, Bld: 103 mg/dL — ABNORMAL HIGH (ref 70–99)
Potassium: 4.2 mmol/L (ref 3.5–5.1)
Sodium: 138 mmol/L (ref 135–145)

## 2023-09-17 DIAGNOSIS — D649 Anemia, unspecified: Secondary | ICD-10-CM | POA: Diagnosis not present

## 2023-09-17 DIAGNOSIS — R7309 Other abnormal glucose: Secondary | ICD-10-CM | POA: Diagnosis not present

## 2023-09-17 DIAGNOSIS — N320 Bladder-neck obstruction: Secondary | ICD-10-CM | POA: Diagnosis not present

## 2023-09-17 DIAGNOSIS — Z800 Family history of malignant neoplasm of digestive organs: Secondary | ICD-10-CM | POA: Diagnosis not present

## 2023-09-17 DIAGNOSIS — Z0000 Encounter for general adult medical examination without abnormal findings: Secondary | ICD-10-CM | POA: Diagnosis not present

## 2023-09-17 DIAGNOSIS — E785 Hyperlipidemia, unspecified: Secondary | ICD-10-CM | POA: Diagnosis not present

## 2023-09-17 DIAGNOSIS — N200 Calculus of kidney: Secondary | ICD-10-CM | POA: Diagnosis not present

## 2023-09-17 DIAGNOSIS — D582 Other hemoglobinopathies: Secondary | ICD-10-CM | POA: Diagnosis not present

## 2023-09-17 DIAGNOSIS — H905 Unspecified sensorineural hearing loss: Secondary | ICD-10-CM | POA: Diagnosis not present

## 2023-09-18 ENCOUNTER — Ambulatory Visit (HOSPITAL_COMMUNITY)

## 2023-09-18 ENCOUNTER — Ambulatory Visit (HOSPITAL_BASED_OUTPATIENT_CLINIC_OR_DEPARTMENT_OTHER): Admitting: Anesthesiology

## 2023-09-18 ENCOUNTER — Encounter (HOSPITAL_COMMUNITY)
Admission: RE | Disposition: A | Discharge status: Home / Self Care | Payer: Self-pay | Source: Ambulatory Visit | Attending: Urology

## 2023-09-18 ENCOUNTER — Encounter (HOSPITAL_COMMUNITY): Payer: Self-pay | Admitting: Urology

## 2023-09-18 ENCOUNTER — Ambulatory Visit (HOSPITAL_COMMUNITY): Admitting: Anesthesiology

## 2023-09-18 ENCOUNTER — Ambulatory Visit (HOSPITAL_COMMUNITY)
Admission: RE | Admit: 2023-09-18 | Discharge: 2023-09-18 | Disposition: A | Discharge status: Home / Self Care | Payer: 59 | Source: Ambulatory Visit | Attending: Urology | Admitting: Urology

## 2023-09-18 DIAGNOSIS — Z87442 Personal history of urinary calculi: Secondary | ICD-10-CM | POA: Diagnosis not present

## 2023-09-18 DIAGNOSIS — Z9889 Other specified postprocedural states: Secondary | ICD-10-CM | POA: Diagnosis not present

## 2023-09-18 DIAGNOSIS — Y732 Prosthetic and other implants, materials and accessory gastroenterology and urology devices associated with adverse incidents: Secondary | ICD-10-CM | POA: Diagnosis not present

## 2023-09-18 DIAGNOSIS — Z466 Encounter for fitting and adjustment of urinary device: Secondary | ICD-10-CM | POA: Diagnosis not present

## 2023-09-18 DIAGNOSIS — R6250 Unspecified lack of expected normal physiological development in childhood: Secondary | ICD-10-CM | POA: Diagnosis not present

## 2023-09-18 DIAGNOSIS — N201 Calculus of ureter: Secondary | ICD-10-CM | POA: Diagnosis not present

## 2023-09-18 DIAGNOSIS — Y838 Other surgical procedures as the cause of abnormal reaction of the patient, or of later complication, without mention of misadventure at the time of the procedure: Secondary | ICD-10-CM | POA: Diagnosis not present

## 2023-09-18 DIAGNOSIS — R8271 Bacteriuria: Secondary | ICD-10-CM | POA: Insufficient documentation

## 2023-09-18 DIAGNOSIS — T198XXA Foreign body in other parts of genitourinary tract, initial encounter: Secondary | ICD-10-CM | POA: Insufficient documentation

## 2023-09-18 DIAGNOSIS — Z905 Acquired absence of kidney: Secondary | ICD-10-CM | POA: Insufficient documentation

## 2023-09-18 DIAGNOSIS — Z960 Presence of urogenital implants: Secondary | ICD-10-CM | POA: Diagnosis not present

## 2023-09-18 HISTORY — PX: CYSTOSCOPY/URETEROSCOPY/HOLMIUM LASER/STENT PLACEMENT: SHX6546

## 2023-09-18 SURGERY — CYSTOSCOPY/URETEROSCOPY/HOLMIUM LASER/STENT PLACEMENT
Anesthesia: General | Laterality: Left

## 2023-09-18 MED ORDER — FENTANYL CITRATE (PF) 100 MCG/2ML IJ SOLN
INTRAMUSCULAR | Status: DC | PRN
  Administered 2023-09-18: 50 ug via INTRAVENOUS

## 2023-09-18 MED ORDER — FENTANYL CITRATE PF 50 MCG/ML IJ SOSY
PREFILLED_SYRINGE | INTRAMUSCULAR | Status: AC
  Administered 2023-09-18: 50 ug via INTRAVENOUS
  Filled 2023-09-18: qty 1

## 2023-09-18 MED ORDER — ACETAMINOPHEN 10 MG/ML IV SOLN
INTRAVENOUS | Status: AC
  Filled 2023-09-18: qty 100

## 2023-09-18 MED ORDER — ORAL CARE MOUTH RINSE: 15.0000 mL | Freq: Once | OROMUCOSAL | Status: AC

## 2023-09-18 MED ORDER — PROPOFOL 10 MG/ML IV BOLUS
INTRAVENOUS | Status: AC
  Filled 2023-09-18: qty 20

## 2023-09-18 MED ORDER — FENTANYL CITRATE PF 50 MCG/ML IJ SOSY
25.0000 ug | PREFILLED_SYRINGE | INTRAMUSCULAR | Status: DC | PRN
  Administered 2023-09-18: 50 ug via INTRAVENOUS

## 2023-09-18 MED ORDER — FENTANYL CITRATE (PF) 100 MCG/2ML IJ SOLN
INTRAMUSCULAR | Status: AC
  Filled 2023-09-18: qty 2

## 2023-09-18 MED ORDER — CHLORHEXIDINE GLUCONATE 0.12 % MT SOLN
15.0000 mL | Freq: Once | OROMUCOSAL | Status: AC
  Administered 2023-09-18: 15 mL via OROMUCOSAL

## 2023-09-18 MED ORDER — PHENYLEPHRINE 80 MCG/ML (10ML) SYRINGE FOR IV PUSH (FOR BLOOD PRESSURE SUPPORT)
PREFILLED_SYRINGE | INTRAVENOUS | Status: AC
  Filled 2023-09-18: qty 10

## 2023-09-18 MED ORDER — OXYCODONE HCL 5 MG/5ML PO SOLN: 5.0000 mg | Freq: Once | ORAL | Status: AC | PRN

## 2023-09-18 MED ORDER — TRAMADOL HCL 50 MG PO TABS: 50.0000 mg | ORAL_TABLET | Freq: Four times a day (QID) | ORAL | 0 refills | Status: DC | PRN

## 2023-09-18 MED ORDER — LIDOCAINE HCL (PF) 2 % IJ SOLN
INTRAMUSCULAR | Status: DC | PRN
  Administered 2023-09-18: 100 mg via INTRADERMAL

## 2023-09-18 MED ORDER — ONDANSETRON HCL 4 MG/2ML IJ SOLN
INTRAMUSCULAR | Status: DC | PRN
  Administered 2023-09-18: 4 mg via INTRAVENOUS

## 2023-09-18 MED ORDER — GENTAMICIN SULFATE 40 MG/ML IJ SOLN
5.0000 mg/kg | INTRAVENOUS | Status: AC
  Administered 2023-09-18: 370 mg via INTRAVENOUS
  Filled 2023-09-18: qty 9.25

## 2023-09-18 MED ORDER — SODIUM CHLORIDE 0.9 % IR SOLN
Status: DC | PRN
  Administered 2023-09-18: 3000 mL via INTRAVESICAL

## 2023-09-18 MED ORDER — LACTATED RINGERS IV SOLN: INTRAVENOUS | Status: DC

## 2023-09-18 MED ORDER — FENTANYL CITRATE PF 50 MCG/ML IJ SOSY
PREFILLED_SYRINGE | INTRAMUSCULAR | Status: AC
  Filled 2023-09-18: qty 1

## 2023-09-18 MED ORDER — ONDANSETRON HCL 4 MG/2ML IJ SOLN: 4.0000 mg | Freq: Once | INTRAMUSCULAR | Status: DC | PRN

## 2023-09-18 MED ORDER — PHENYLEPHRINE 80 MCG/ML (10ML) SYRINGE FOR IV PUSH (FOR BLOOD PRESSURE SUPPORT)
PREFILLED_SYRINGE | INTRAVENOUS | Status: DC | PRN
  Administered 2023-09-18: 80 ug via INTRAVENOUS

## 2023-09-18 MED ORDER — ACETAMINOPHEN 10 MG/ML IV SOLN
1000.0000 mg | Freq: Once | INTRAVENOUS | Status: DC | PRN
  Administered 2023-09-18: 1000 mg via INTRAVENOUS

## 2023-09-18 MED ORDER — PROPOFOL 10 MG/ML IV BOLUS
INTRAVENOUS | Status: DC | PRN
  Administered 2023-09-18: 150 mg via INTRAVENOUS

## 2023-09-18 MED ORDER — DEXAMETHASONE SODIUM PHOSPHATE 10 MG/ML IJ SOLN
INTRAMUSCULAR | Status: DC | PRN
  Administered 2023-09-18: 10 mg via INTRAVENOUS

## 2023-09-18 MED ORDER — MIDAZOLAM HCL 2 MG/2ML IJ SOLN
INTRAMUSCULAR | Status: AC
  Filled 2023-09-18: qty 2

## 2023-09-18 MED ORDER — OXYCODONE HCL 5 MG PO TABS
ORAL_TABLET | ORAL | Status: AC
  Filled 2023-09-18: qty 1

## 2023-09-18 MED ORDER — HYDROMORPHONE HCL 1 MG/ML IJ SOLN
0.2500 mg | INTRAMUSCULAR | Status: DC | PRN
  Administered 2023-09-18: 0.5 mg via INTRAVENOUS

## 2023-09-18 MED ORDER — OXYCODONE HCL 5 MG PO TABS
5.0000 mg | ORAL_TABLET | Freq: Once | ORAL | Status: AC | PRN
  Administered 2023-09-18: 5 mg via ORAL

## 2023-09-18 MED ORDER — MIDAZOLAM HCL 5 MG/5ML IJ SOLN
INTRAMUSCULAR | Status: DC | PRN
  Administered 2023-09-18: 2 mg via INTRAVENOUS

## 2023-09-18 MED ORDER — HYDROMORPHONE HCL 1 MG/ML IJ SOLN
INTRAMUSCULAR | Status: AC
  Filled 2023-09-18: qty 1

## 2023-09-18 SURGICAL SUPPLY — 20 items
BAG URO CATCHER STRL LF (MISCELLANEOUS) ×1 IMPLANT
BASKET LASER NITINOL 1.9FR (BASKET) IMPLANT
BASKET ZERO TIP 1.9FR (BASKET) IMPLANT
CATH URETL OPEN END 6FR 70 (CATHETERS) ×1 IMPLANT
CLOTH BEACON ORANGE TIMEOUT ST (SAFETY) ×1 IMPLANT
EXTRACTOR STONE 1.7FRX115CM (UROLOGICAL SUPPLIES) IMPLANT
GLOVE SURG LX STRL 7.5 STRW (GLOVE) ×1 IMPLANT
GOWN STRL REUS W/ TWL XL LVL3 (GOWN DISPOSABLE) ×1 IMPLANT
GUIDEWIRE ANG ZIPWIRE 038X150 (WIRE) ×1 IMPLANT
GUIDEWIRE STR DUAL SENSOR (WIRE) ×1 IMPLANT
KIT TURNOVER KIT A (KITS) IMPLANT
LASER FIB FLEXIVA PULSE ID 365 (Laser) IMPLANT
MANIFOLD NEPTUNE II (INSTRUMENTS) ×1 IMPLANT
PACK CYSTO (CUSTOM PROCEDURE TRAY) ×1 IMPLANT
SHEATH NAVIGATOR HD 11/13X28 (SHEATH) IMPLANT
SHEATH NAVIGATOR HD 11/13X36 (SHEATH) IMPLANT
TRACTIP FLEXIVA PULS ID 200XHI (Laser) IMPLANT
TUBE PU 8FR 16IN ENFIT (TUBING) ×1 IMPLANT
TUBING CONNECTING 10 (TUBING) ×1 IMPLANT
TUBING UROLOGY SET (TUBING) ×1 IMPLANT
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
## 2023-09-18 NOTE — Anesthesia Procedure Notes (Signed)
 Procedure Name: LMA Insertion Date/Time: 09/18/2023 3:09 PM  Performed by: Doran Clay, CRNAPre-anesthesia Checklist: Patient identified, Emergency Drugs available, Suction available, Patient being monitored and Timeout performed Patient Re-evaluated:Patient Re-evaluated prior to induction Oxygen Delivery Method: Circle system utilized Preoxygenation: Pre-oxygenation with 100% oxygen Induction Type: IV induction LMA: LMA inserted LMA Size: 4.0 Tube type: Oral Number of attempts: 1 Placement Confirmation: positive ETCO2 and breath sounds checked- equal and bilateral Tube secured with: Tape Dental Injury: Teeth and Oropharynx as per pre-operative assessment

## 2023-09-18 NOTE — Discharge Instructions (Signed)
 1 - You may have urinary urgency (bladder spasms) and bloody urine on / off for 2 days. This is normal.  2 - Call MD or go to ER for fever >102, severe pain / nausea / vomiting not relieved by medications, or acute change in medical status

## 2023-09-18 NOTE — Anesthesia Postprocedure Evaluation (Signed)
 Anesthesia Post Note  Patient: Philip Stark  Procedure(s) Performed: CYSTOSCOPY/ LEFT URETEROSCOPY/STENT REMOVAL (Left)     Patient location during evaluation: PACU Anesthesia Type: General Level of consciousness: awake and alert and oriented Pain management: pain level controlled Vital Signs Assessment: post-procedure vital signs reviewed and stable Respiratory status: spontaneous breathing, nonlabored ventilation and respiratory function stable Cardiovascular status: blood pressure returned to baseline and stable Postop Assessment: no apparent nausea or vomiting Anesthetic complications: no   No notable events documented.  Last Vitals:  Vitals:   09/18/23 1535 09/18/23 1545  BP: 107/68 127/87  Pulse: 66 85  Resp: 14 15  Temp: (!) 36.3 C   SpO2: 99% 98%    Last Pain:  Vitals:   09/18/23 1630  TempSrc:   PainSc: 10-Worst pain ever                 Noemi Bellissimo A.

## 2023-09-18 NOTE — Transfer of Care (Signed)
 Immediate Anesthesia Transfer of Care Note  Patient: Philip Stark  Procedure(s) Performed: CYSTOSCOPY/ LEFT URETEROSCOPY/STENT REMOVAL (Left)  Patient Location: PACU  Anesthesia Type:General  Level of Consciousness: drowsy  Airway & Oxygen Therapy: Patient Spontanous Breathing and Patient connected to face mask oxygen  Post-op Assessment: Report given to RN, Post -op Vital signs reviewed and stable, and Patient moving all extremities X 4  Post vital signs: Reviewed and stable  Last Vitals:  Vitals Value Taken Time  BP 107/68 09/18/23 1535  Temp 36.3 C 09/18/23 1535  Pulse 67 09/18/23 1537  Resp 12 09/18/23 1537  SpO2 99 % 09/18/23 1537  Vitals shown include unfiled device data.  Last Pain:  Vitals:   09/18/23 1404  TempSrc:   PainSc: 0-No pain         Complications: No notable events documented.

## 2023-09-18 NOTE — Anesthesia Preprocedure Evaluation (Signed)
 Anesthesia Evaluation  Patient identified by MRN, date of birth, ID band Patient awake    Reviewed: Allergy & Precautions, NPO status , Patient's Chart, lab work & pertinent test results, reviewed documented beta blocker date and time   Airway Mallampati: II       Dental no notable dental hx. (+) Teeth Intact, Caps, Dental Advisory Given   Pulmonary neg pulmonary ROS   Pulmonary exam normal breath sounds clear to auscultation       Cardiovascular negative cardio ROS Normal cardiovascular exam Rhythm:Regular Rate:Normal     Neuro/Psych  Headaches PSYCHIATRIC DISORDERS      Learning disability   GI/Hepatic negative GI ROS, Neg liver ROS,,,  Endo/Other  negative endocrine ROS    Renal/GU Renal diseaseXanthogranulomatous pyelonephritis  negative genitourinary   Musculoskeletal negative musculoskeletal ROS (+)    Abdominal   Peds  Hematology negative hematology ROS (+)   Anesthesia Other Findings   Reproductive/Obstetrics                             Anesthesia Physical Anesthesia Plan  ASA: 2  Anesthesia Plan: General   Post-op Pain Management: Minimal or no pain anticipated and Precedex   Induction: Intravenous  PONV Risk Score and Plan: 4 or greater and Treatment may vary due to age or medical condition, Ondansetron and Dexamethasone  Airway Management Planned: LMA  Additional Equipment: None  Intra-op Plan:   Post-operative Plan: Extubation in OR  Informed Consent: I have reviewed the patients History and Physical, chart, labs and discussed the procedure including the risks, benefits and alternatives for the proposed anesthesia with the patient or authorized representative who has indicated his/her understanding and acceptance.     Dental advisory given  Plan Discussed with: CRNA and Anesthesiologist  Anesthesia Plan Comments:        Anesthesia Quick Evaluation

## 2023-09-18 NOTE — Brief Op Note (Signed)
 09/18/2023  3:26 PM  PATIENT:  Philip Stark  58 y.o. male  PRE-OPERATIVE DIAGNOSIS:  RETAINED LEFT URETERAL STONE  POST-OPERATIVE DIAGNOSIS:  RETAINED LEFT URETERAL STONE  PROCEDURE:  Procedure(s) with comments: CYSTOSCOPY/ LEFT URETEROSCOPY/STENT REMOVAL (Left) - 45 MINUTES NEEDED FOR CASE  SURGEON:  Surgeons and Role:    * Manny, Delbert Phenix., MD - Primary  PHYSICIAN ASSISTANT:   ASSISTANTS: none   ANESTHESIA:   general  EBL:  minimal   BLOOD ADMINISTERED:none  DRAINS: none   LOCAL MEDICATIONS USED:  NONE  SPECIMEN:  Source of Specimen:  left ureteral stent  DISPOSITION OF SPECIMEN:   discard  COUNTS:  YES  TOURNIQUET:  * No tourniquets in log *  DICTATION: .Other Dictation: Dictation Number [id number]  PLAN OF CARE: Discharge to home after PACU  PATIENT DISPOSITION:  PACU - hemodynamically stable.   Delay start of Pharmacological VTE agent (>24hrs) due to surgical blood loss or risk of bleeding: not applicable

## 2023-09-19 ENCOUNTER — Encounter (HOSPITAL_COMMUNITY): Payer: Self-pay | Admitting: Urology

## 2023-09-19 NOTE — Op Note (Signed)
 NAMEDAANISH, COPES MEDICAL RECORD NO: [id number] ACCOUNT NO: [id number] DATE OF BIRTH: 11-29-1965 FACILITY: [facility] LOCATION: [facility] PHYSICIAN: Sebastian Ache, MD  Operative Report   DATE OF PROCEDURE: 09/18/2023  PREOPERATIVE DIAGNOSIS: Retained left ureteral stent.  PROCEDURE PERFORMED: Cystoscopy with left ureteral stent removal. Intraoperative fluoroscopy.  ESTIMATED BLOOD LOSS:   Nil.   COMPLICATIONS:  None.  SPECIMENS: Left ureteral stent for discard.  FINDINGS: Proximally migrated left ureteral stent distal end approximately 2 cm above the ureteral orifice. Successful removal of left ureteral stent, complete and intact.  INDICATIONS: The patient is a 58 year old man with a history of developmental delay and recurrent urolithiasis and solitary left kidney. He is status post recent and staged ureteroscopy to stone free on the left side in order to protect his solitary  kidney. He presented for stent removal; however, the stent was not accessible as it had migrated proximally. This was confirmed on KUB imaging. Options were discussed including the recommended path of stent removal under anesthesia   DESCRIPTION OF PROCEDURE: The patient being and procedure being cysto and stent pull confirmed, timeout was performed.  Intravenous antibiotics were verified.  General LMA anesthesia induced. The patient was placed into a low lithotomy position.  A sterile field was created. Prepped and draped in the penis, perineum, proximal thighs using  iodine. Cystourethroscope formed using a 21-French rigid cystoscope with offset lens. Inspection of the posterior third was unremarkable. Inspection of the urinary bladder revealed no diverticula, calcifications, papillary lesions. The ureteral orifices  were single but somewhat widely displaced.   Spot fluoroscopic imaging confirmed persistence of left ureteral stent; however, the distal limb was not visualized confirming that this remained  proximally migrated.   Cystoscope was then exchanged for a  semi-rigid ureteroscope and the distal left ureter was visualized. The distal end of the stent was in question was seen approximately 2 cm proximal to the orifice. An escape-type basket was used with a snaring technique. The lower aspect of the stent was  snared. It was then brought out in its entirety, it was set aside to discard. It was inspected and intact. The procedure was terminated. The patient tolerated the procedure well. No immediate periprocedural complications.  The patient was taken post  anesthesia care unit in stable condition.  Plan for discharge home.      SUJ D: 09/18/2023 3:30:21 pm T: 09/19/2023 12:03:00 am  JOB: [id number]/ [id number]

## 2023-09-23 DIAGNOSIS — R8271 Bacteriuria: Secondary | ICD-10-CM | POA: Diagnosis not present

## 2023-09-23 DIAGNOSIS — N451 Epididymitis: Secondary | ICD-10-CM | POA: Diagnosis not present

## 2023-10-01 DIAGNOSIS — R8271 Bacteriuria: Secondary | ICD-10-CM | POA: Diagnosis not present

## 2023-10-01 DIAGNOSIS — Z905 Acquired absence of kidney: Secondary | ICD-10-CM | POA: Diagnosis not present

## 2023-10-01 DIAGNOSIS — N200 Calculus of kidney: Secondary | ICD-10-CM | POA: Diagnosis not present

## 2023-10-04 DIAGNOSIS — Z800 Family history of malignant neoplasm of digestive organs: Secondary | ICD-10-CM | POA: Diagnosis not present

## 2023-10-10 DIAGNOSIS — N200 Calculus of kidney: Secondary | ICD-10-CM | POA: Diagnosis not present

## 2023-10-11 DIAGNOSIS — N200 Calculus of kidney: Secondary | ICD-10-CM | POA: Diagnosis not present

## 2023-10-11 DIAGNOSIS — Z905 Acquired absence of kidney: Secondary | ICD-10-CM | POA: Diagnosis not present

## 2023-10-11 DIAGNOSIS — N3021 Other chronic cystitis with hematuria: Secondary | ICD-10-CM | POA: Diagnosis not present

## 2023-10-11 DIAGNOSIS — R8271 Bacteriuria: Secondary | ICD-10-CM | POA: Diagnosis not present

## 2023-10-30 DIAGNOSIS — R300 Dysuria: Secondary | ICD-10-CM | POA: Diagnosis not present

## 2023-10-30 DIAGNOSIS — N390 Urinary tract infection, site not specified: Secondary | ICD-10-CM | POA: Diagnosis not present

## 2023-11-05 DIAGNOSIS — N200 Calculus of kidney: Secondary | ICD-10-CM | POA: Diagnosis not present

## 2023-11-05 DIAGNOSIS — Z905 Acquired absence of kidney: Secondary | ICD-10-CM | POA: Diagnosis not present

## 2023-11-05 DIAGNOSIS — N3021 Other chronic cystitis with hematuria: Secondary | ICD-10-CM | POA: Diagnosis not present

## 2023-11-05 DIAGNOSIS — R8271 Bacteriuria: Secondary | ICD-10-CM | POA: Diagnosis not present

## 2023-11-06 DIAGNOSIS — R350 Frequency of micturition: Secondary | ICD-10-CM | POA: Diagnosis not present

## 2023-11-06 DIAGNOSIS — N200 Calculus of kidney: Secondary | ICD-10-CM | POA: Diagnosis not present

## 2023-11-06 DIAGNOSIS — N3021 Other chronic cystitis with hematuria: Secondary | ICD-10-CM | POA: Diagnosis not present

## 2023-12-04 DIAGNOSIS — T7840XA Allergy, unspecified, initial encounter: Secondary | ICD-10-CM | POA: Diagnosis not present

## 2023-12-04 DIAGNOSIS — E785 Hyperlipidemia, unspecified: Secondary | ICD-10-CM | POA: Diagnosis not present

## 2024-01-01 DIAGNOSIS — N3021 Other chronic cystitis with hematuria: Secondary | ICD-10-CM | POA: Diagnosis not present

## 2024-01-01 DIAGNOSIS — R350 Frequency of micturition: Secondary | ICD-10-CM | POA: Diagnosis not present

## 2024-01-02 DIAGNOSIS — B356 Tinea cruris: Secondary | ICD-10-CM | POA: Diagnosis not present

## 2024-01-13 DIAGNOSIS — N3021 Other chronic cystitis with hematuria: Secondary | ICD-10-CM | POA: Diagnosis not present

## 2024-01-13 DIAGNOSIS — N200 Calculus of kidney: Secondary | ICD-10-CM | POA: Diagnosis not present

## 2024-01-22 DIAGNOSIS — L308 Other specified dermatitis: Secondary | ICD-10-CM | POA: Diagnosis not present

## 2024-03-02 DIAGNOSIS — L308 Other specified dermatitis: Secondary | ICD-10-CM | POA: Diagnosis not present

## 2024-03-17 DIAGNOSIS — R7303 Prediabetes: Secondary | ICD-10-CM | POA: Diagnosis not present

## 2024-03-17 DIAGNOSIS — E785 Hyperlipidemia, unspecified: Secondary | ICD-10-CM | POA: Diagnosis not present

## 2024-03-17 DIAGNOSIS — D582 Other hemoglobinopathies: Secondary | ICD-10-CM | POA: Diagnosis not present

## 2024-03-20 ENCOUNTER — Other Ambulatory Visit (HOSPITAL_BASED_OUTPATIENT_CLINIC_OR_DEPARTMENT_OTHER): Payer: Self-pay | Admitting: Family Medicine

## 2024-03-20 DIAGNOSIS — H905 Unspecified sensorineural hearing loss: Secondary | ICD-10-CM | POA: Diagnosis not present

## 2024-03-20 DIAGNOSIS — Z905 Acquired absence of kidney: Secondary | ICD-10-CM | POA: Diagnosis not present

## 2024-03-20 DIAGNOSIS — E785 Hyperlipidemia, unspecified: Secondary | ICD-10-CM

## 2024-03-20 DIAGNOSIS — N200 Calculus of kidney: Secondary | ICD-10-CM | POA: Diagnosis not present

## 2024-03-20 DIAGNOSIS — R7309 Other abnormal glucose: Secondary | ICD-10-CM | POA: Diagnosis not present

## 2024-04-06 DIAGNOSIS — N200 Calculus of kidney: Secondary | ICD-10-CM | POA: Diagnosis not present

## 2024-04-08 ENCOUNTER — Ambulatory Visit (HOSPITAL_BASED_OUTPATIENT_CLINIC_OR_DEPARTMENT_OTHER)
Admission: RE | Admit: 2024-04-08 | Discharge: 2024-04-08 | Disposition: A | Discharge status: Home / Self Care | Payer: Self-pay | Source: Ambulatory Visit | Attending: Family Medicine | Admitting: Family Medicine

## 2024-04-08 DIAGNOSIS — E785 Hyperlipidemia, unspecified: Secondary | ICD-10-CM | POA: Insufficient documentation

## 2024-04-14 DIAGNOSIS — D485 Neoplasm of uncertain behavior of skin: Secondary | ICD-10-CM | POA: Diagnosis not present

## 2024-04-14 DIAGNOSIS — L308 Other specified dermatitis: Secondary | ICD-10-CM | POA: Diagnosis not present

## 2024-04-21 DIAGNOSIS — N3020 Other chronic cystitis without hematuria: Secondary | ICD-10-CM | POA: Diagnosis not present

## 2024-04-21 DIAGNOSIS — Z905 Acquired absence of kidney: Secondary | ICD-10-CM | POA: Diagnosis not present

## 2024-04-21 DIAGNOSIS — R3914 Feeling of incomplete bladder emptying: Secondary | ICD-10-CM | POA: Diagnosis not present

## 2024-04-21 DIAGNOSIS — N200 Calculus of kidney: Secondary | ICD-10-CM | POA: Diagnosis not present

## 2024-04-28 ENCOUNTER — Emergency Department (HOSPITAL_COMMUNITY)
Admission: EM | Admit: 2024-04-28 | Discharge: 2024-04-28 | Disposition: A | Discharge status: Home / Self Care | Source: Ambulatory Visit | Attending: Emergency Medicine | Admitting: Emergency Medicine

## 2024-04-28 ENCOUNTER — Other Ambulatory Visit: Payer: Self-pay

## 2024-04-28 ENCOUNTER — Encounter (HOSPITAL_COMMUNITY): Payer: Self-pay

## 2024-04-28 DIAGNOSIS — N3000 Acute cystitis without hematuria: Secondary | ICD-10-CM | POA: Insufficient documentation

## 2024-04-28 DIAGNOSIS — R350 Frequency of micturition: Secondary | ICD-10-CM | POA: Diagnosis present

## 2024-04-28 DIAGNOSIS — Z91040 Latex allergy status: Secondary | ICD-10-CM | POA: Insufficient documentation

## 2024-04-28 DIAGNOSIS — D72819 Decreased white blood cell count, unspecified: Secondary | ICD-10-CM | POA: Insufficient documentation

## 2024-04-28 DIAGNOSIS — N3941 Urge incontinence: Secondary | ICD-10-CM

## 2024-04-28 LAB — URINALYSIS, ROUTINE W REFLEX MICROSCOPIC
Bilirubin Urine: NEGATIVE
Glucose, UA: NEGATIVE mg/dL
Ketones, ur: NEGATIVE mg/dL
Nitrite: POSITIVE — AB
Protein, ur: NEGATIVE mg/dL
Specific Gravity, Urine: 1.013 (ref 1.005–1.030)
WBC, UA: >50 WBC/hpf (ref 0–5)
pH: 7 (ref 5.0–8.0)

## 2024-04-28 MED ORDER — FOSFOMYCIN TROMETHAMINE 3 G PO PACK: 3.0000 g | PACK | Freq: Once | ORAL | 0 refills | Status: AC

## 2024-04-28 NOTE — Progress Notes (Signed)
 Subjective Patient ID: Philip Stark is a 58 y.o. male.  Chief Complaint  Patient presents with  . Urinary Incontinence    Pt reports not being able hold his bladder.  Mother reports pt self caths.  Hx of kidney stones, UTI's and bladder issues. (Main complaint) Request prescription to purchase in-out bladder bag. Allergy to silicone    The following information was reviewed by members of the visit team:  Tobacco  Allergies  Meds  Surg Hx      58 year old male with extensive history of kidney and bladder surgeries presents for evaluation of urinary incontinence.  Patient states he has been leaking urine over the past day or 2.  He is requesting a urinary catheter.  States that urinary incontinence is affecting his work and would like a catheter placed so that he does not have to go to the bathroom as often.  States he used to use catheters at home but has not done so for a while.  He has appointment upcoming with new urologist in a couple weeks to evaluate the need for catheters.  He denies any dysuria.  No abdominal pain, no fever, nausea, vomiting.  Requesting a catheter be placed in clinic or prescription to be provided for catheter at home.    Review of Systems  Constitutional:  Negative for chills and fever.  Respiratory:  Negative for shortness of breath.   Gastrointestinal:  Negative for abdominal pain, nausea and vomiting.  Genitourinary:  Negative for difficulty urinating, dysuria, flank pain, frequency, penile pain, penile swelling and testicular pain.  Musculoskeletal:  Negative for arthralgias, back pain and myalgias.  Skin:  Negative for rash.  Neurological:  Negative for dizziness.  Hematological:  Negative for adenopathy. Does not bruise/bleed easily.    Objective Physical Exam Vitals reviewed.  Constitutional:      General: He is not in acute distress.    Appearance: Normal appearance. He is not ill-appearing, toxic-appearing or diaphoretic.   Cardiovascular:     Rate and Rhythm: Normal rate and regular rhythm.     Pulses: Normal pulses.  Pulmonary:     Effort: Pulmonary effort is normal. No respiratory distress.  Abdominal:     Tenderness: There is no right CVA tenderness or left CVA tenderness.  Skin:    General: Skin is warm and dry.     Capillary Refill: Capillary refill takes less than 2 seconds.     Findings: No rash.  Neurological:     Mental Status: He is alert and oriented to person, place, and time.  Psychiatric:        Mood and Affect: Mood normal.     Assessment/Plan  58 year old male with extensive history of bladder and kidney issues requesting catheter due to urinary incontinence. Discussed with patient and mother that we would not be able to provide urinary catheter in clinic as we do not have the equipment.  Also would not recommend placing urinary catheter without discussion with urology.  Recommend that patient and mother discussed with their urologist for their recommendations on how to proceed with treatment. Did offer/recommend to check urinalysis to assess for infection.  Patient does not feel he has urine infection as he has no other symptoms and does not want urine testing at this time. Patient is in no acute distress, vital signs are within normal limits.  Recommend close follow-up with PCP and urologist for further evaluation and management. Urgent Care Disposition:  Home Care   Electronically signed: Franky Floria Finder, PA-C  04/28/2024  9:49 AM

## 2024-04-28 NOTE — ED Provider Notes (Signed)
 [place] EMERGENCY DEPARTMENT AT [facility] Provider Note   CSN: [id number] Arrival date & time: 04/28/24  1000     Patient presents with: Urinary Frequency   Philip Stark is a 58 y.o. male history of developmental delay, previous diagnosis of xanthogranulomatous pyelonephritis, recurrent nephrolithiasis, followed by urology for same.  Presents to the ED today out of concern for urinary incontinence.  He is here with his mother, she states that he frequently loses control of his bladder and this causes him to become anxious and upset.  Has led to him having difficulty with going to work, requesting placement of indwelling Foley catheter for management of persistent urinary incontinence.  Followed by [facility], apparently had had previous discussions of indwelling Foley, visited urgent care this morning however was directed to follow-up with urology.  As but the patient and his mother feel this is more urgent, they presented to the ED for evaluation and urological consultation.  He denies having any pain, any dysuria, denies any nausea or vomiting, and otherwise is asymptomatic has no complaints other than persistent bladder incontinence which is secondary to his renal history.    Urinary Frequency       Prior to Admission medications   Medication Sig Start Date End Date Taking? Authorizing Provider  fosfomycin (MONUROL) 3 g PACK Take 3 g by mouth once for 1 dose. 04/28/24 04/28/24 Yes Myriam Dorn BROCKS, PA  acetaminophen  (TYLENOL ) 500 MG tablet Take 500-1,000 mg by mouth every 6 (six) hours as needed (pain.).    [provider]  Cephalexin  500 MG tablet Take 500 mg by mouth 2 (two) times daily. 09/06/23   [provider]  montelukast (SINGULAIR) 10 MG tablet Take 10 mg by mouth daily as needed (allergies). 11/14/20   [provider]  rosuvastatin (CRESTOR) 20 MG tablet Take 20 mg by mouth daily.    [provider]  traMADol   (ULTRAM ) 50 MG tablet Take 1 tablet (50 mg total) by mouth every 6 (six) hours as needed for moderate pain (pain score 4-6) (post-operatively). 09/18/23   Alvaro Ricardo KATHEE Mickey., MD    Allergies: Penicillins, Azithromycin , Ceftriaxone, Ciprofloxacin , Clindamycin/lincomycin, Doxycycline , Latex, Methocarbamol , Phenazopyridine hcl, Silicone, Sulfa antibiotics, and Vancomycin    Review of Systems  Genitourinary:  Positive for frequency.  All other systems reviewed and are negative.   Updated Vital Signs BP (!) 148/95 (BP Location: Left Arm)   Pulse (!) 101   Temp 97.7 F (36.5 C) (Oral)   Resp 16   SpO2 99%   Physical Exam Vitals and nursing note reviewed.  Constitutional:      General: He is not in acute distress.    Appearance: He is well-developed.  HENT:     Head: Normocephalic and atraumatic.  Eyes:     Conjunctiva/sclera: Conjunctivae normal.  Cardiovascular:     Rate and Rhythm: Normal rate and regular rhythm.     Heart sounds: No murmur heard. Pulmonary:     Effort: Pulmonary effort is normal. No respiratory distress.     Breath sounds: Normal breath sounds.  Abdominal:     Palpations: Abdomen is soft.     Tenderness: There is no abdominal tenderness.  Musculoskeletal:        General: No swelling.     Cervical back: Neck supple.  Skin:    General: Skin is warm and dry.     Capillary Refill: Capillary refill takes less than 2 seconds.  Neurological:  Mental Status: He is alert.  Psychiatric:        Mood and Affect: Mood normal.     (all labs ordered are listed, but only abnormal results are displayed) Labs Reviewed  URINALYSIS, ROUTINE W REFLEX MICROSCOPIC - Abnormal; Notable for the following components:      Result Value   APPearance CLOUDY (*)    Hgb urine dipstick SMALL (*)    Nitrite POSITIVE (*)    Leukocytes,Ua LARGE (*)    Bacteria, UA MANY (*)    All other components within normal limits    EKG: None  Radiology: No results  found.   Procedures   Medications Ordered in the ED - No data to display                                  Medical Decision Making Amount and/or Complexity of Data Reviewed Labs: ordered.  Risk Prescription drug management.   Given his urinary frequency and his urge incontinence, urinalysis was obtained which does show nitrite and leukocyte positive UTI.  Consulted with C. Sattenfield,NP regarding patient's desire for indwelling Foley catheter, follows with [facility].  He reviewed urology notes, stated that had previously been worked up for her neurogenic bladder which was negative at the time, essentially states that at this time if patient request indwelling Foley catheter that this can be placed however would recommend against it, ultimately would be the patient's decision.  [place] with the patient and his mother, discussed findings of acute UTI, they insist that Foley catheter be placed for this patient.  Orders placed and Foley catheter inserted by nursing staff.  Discussed with patient and his mother the need to follow-up with urology and/or primary care for DME orders regarding continued Foley care.  They understand and agree have no further concerns at this time.  As he is asymptomatic, has normal vital signs are within normal limits and stable during his stay here in the ED find that he is stable for outpatient management discharge at this time.     Final diagnoses:  Acute cystitis without hematuria  Urge incontinence of urine    ED Discharge Orders          Ordered    fosfomycin (MONUROL) 3 g PACK   Once        04/28/24 1146               Myriam Dorn BROCKS, [place] 04/28/24 1248    Mannie Pac T, DO 04/30/24 [phone]

## 2024-04-28 NOTE — ED Triage Notes (Signed)
 Pt reports with urinary frequency x 1 week. Pt follows urology but does not have an appt until next month.

## 2024-04-28 NOTE — ED Notes (Signed)
 Foley catheter placed by this RN. Foley care including changing standard bag and leg bag, emptying foley, and catheter care explained with patient and mother with verbalization and teach-back.

## 2024-05-01 DIAGNOSIS — R339 Retention of urine, unspecified: Secondary | ICD-10-CM | POA: Diagnosis not present

## 2024-05-11 ENCOUNTER — Emergency Department (HOSPITAL_COMMUNITY)
Admission: EM | Admit: 2024-05-11 | Discharge: 2024-05-11 | Disposition: A | Discharge status: Home / Self Care | Attending: Emergency Medicine | Admitting: Emergency Medicine

## 2024-05-11 DIAGNOSIS — T83098A Other mechanical complication of other indwelling urethral catheter, initial encounter: Secondary | ICD-10-CM | POA: Insufficient documentation

## 2024-05-11 DIAGNOSIS — R339 Retention of urine, unspecified: Secondary | ICD-10-CM | POA: Diagnosis present

## 2024-05-11 DIAGNOSIS — Z91040 Latex allergy status: Secondary | ICD-10-CM | POA: Insufficient documentation

## 2024-05-11 DIAGNOSIS — Y732 Prosthetic and other implants, materials and accessory gastroenterology and urology devices associated with adverse incidents: Secondary | ICD-10-CM | POA: Insufficient documentation

## 2024-05-11 DIAGNOSIS — T83091A Other mechanical complication of indwelling urethral catheter, initial encounter: Secondary | ICD-10-CM | POA: Diagnosis not present

## 2024-05-11 DIAGNOSIS — T839XXA Unspecified complication of genitourinary prosthetic device, implant and graft, initial encounter: Secondary | ICD-10-CM

## 2024-05-11 NOTE — ED Provider Notes (Signed)
 [place] EMERGENCY DEPARTMENT AT [facility] Provider Note   CSN: [id number] Arrival date & time: 05/11/24  1810     Patient presents with: Urinary Retention   Philip Stark is a 58 y.o. male.  {Add pertinent medical, surgical, social history, OB history to YEP:67052} Patient has a Foley catheter in place.  Patient reports when he changed the bags today catheter stopped draining.  Patient currently has drainage in his leg bag.  Patient had Foley catheter placed 2 weeks ago because of urinary incontinence.  Patient was advised to follow-up with urology.  The history is provided by the patient.       Prior to Admission medications   Medication Sig Start Date End Date Taking? Authorizing Provider  acetaminophen  (TYLENOL ) 500 MG tablet Take 500-1,000 mg by mouth every 6 (six) hours as needed (pain.).    [provider]  Cephalexin  500 MG tablet Take 500 mg by mouth 2 (two) times daily. 09/06/23   [provider]  montelukast (SINGULAIR) 10 MG tablet Take 10 mg by mouth daily as needed (allergies). 11/14/20   [provider]  rosuvastatin (CRESTOR) 20 MG tablet Take 20 mg by mouth daily.    [provider]  traMADol  (ULTRAM ) 50 MG tablet Take 1 tablet (50 mg total) by mouth every 6 (six) hours as needed for moderate pain (pain score 4-6) (post-operatively). 09/18/23   Alvaro Ricardo KATHEE Mickey., MD    Allergies: Penicillins, Azithromycin , Ceftriaxone, Ciprofloxacin , Clindamycin/lincomycin, Doxycycline , Latex, Methocarbamol , Phenazopyridine hcl, Silicone, Sulfa antibiotics, and Vancomycin    Review of Systems  All other systems reviewed and are negative.   Updated Vital Signs BP (!) 155/92 (BP Location: Right Arm)   Pulse (!) 106   Temp (!) 97.5 F (36.4 C) (Oral)   Resp 16   SpO2 100%   Physical Exam Vitals and nursing note reviewed.  Constitutional:      Appearance: He is well-developed.  HENT:     Head: Normocephalic.   Cardiovascular:     Rate and Rhythm: Normal rate.  Pulmonary:     Effort: Pulmonary effort is normal.  Abdominal:     General: Abdomen is flat. There is no distension.     Comments: Foley in place, draining  Musculoskeletal:        General: Normal range of motion.     Cervical back: Normal range of motion.  Skin:    General: Skin is warm.  Neurological:     General: No focal deficit present.     Mental Status: He is alert and oriented to person, place, and time.  Psychiatric:        Mood and Affect: Mood normal.     (all labs ordered are listed, but only abnormal results are displayed) Labs Reviewed - No data to display  EKG: None  Radiology: No results found.  {Document cardiac monitor, telemetry assessment procedure when appropriate:32947} Procedures   Medications Ordered in the ED - No data to display    {Click here for ABCD2, HEART and other calculators REFRESH Note before signing:1}                              Medical Decision Making  ***  {Document critical care time when appropriate  Document review of labs and clinical decision tools ie CHADS2VASC2, etc  Document your independent review of radiology images and any outside records  Document your discussion with family  members, caretakers and with consultants  Document social determinants of health affecting pt's care  Document your decision making why or why not admission, treatments were needed:32947:::1}   Final diagnoses:  None    ED Discharge Orders     None

## 2024-05-11 NOTE — ED Triage Notes (Signed)
 Pt states he has foley catheter in place and since 1700 does not feel as though he is draining. Pt states this has happened in the past and has required changing catheters. No distress noted in triage.

## 2024-05-12 ENCOUNTER — Other Ambulatory Visit (HOSPITAL_COMMUNITY): Payer: Self-pay | Admitting: Family Medicine

## 2024-05-12 DIAGNOSIS — R008 Other abnormalities of heart beat: Secondary | ICD-10-CM | POA: Diagnosis not present

## 2024-05-12 DIAGNOSIS — N390 Urinary tract infection, site not specified: Secondary | ICD-10-CM | POA: Diagnosis not present

## 2024-05-14 ENCOUNTER — Ambulatory Visit (HOSPITAL_COMMUNITY)
Admission: RE | Admit: 2024-05-14 | Discharge: 2024-05-14 | Disposition: A | Discharge status: Home / Self Care | Source: Ambulatory Visit | Attending: Family Medicine | Admitting: Family Medicine

## 2024-05-14 DIAGNOSIS — R008 Other abnormalities of heart beat: Secondary | ICD-10-CM | POA: Diagnosis not present

## 2024-05-14 LAB — ECHOCARDIOGRAM COMPLETE
AR max vel: 3.04 cm2
AV Area VTI: 2.74 cm2
AV Area mean vel: 2.72 cm2
AV Mean grad: 3 mmHg
AV Peak grad: 5.4 mmHg
Ao pk vel: 1.16 m/s
Area-P 1/2: 3.72 cm2
Est EF: 55
MV M vel: 5.38 m/s
MV Peak grad: 115.8 mmHg
Radius: 0.6 cm
S' Lateral: 2.9 cm

## 2024-06-18 ENCOUNTER — Encounter: Payer: Self-pay | Admitting: Cardiology

## 2024-06-18 ENCOUNTER — Ambulatory Visit: Attending: Cardiology | Admitting: Cardiology

## 2024-06-18 VITALS — BP 130/90 | HR 70 | Resp 16 | Ht 68.0 in | Wt 165.9 lb

## 2024-06-18 DIAGNOSIS — I341 Nonrheumatic mitral (valve) prolapse: Secondary | ICD-10-CM

## 2024-06-18 DIAGNOSIS — R030 Elevated blood-pressure reading, without diagnosis of hypertension: Secondary | ICD-10-CM | POA: Diagnosis not present

## 2024-06-18 NOTE — Patient Instructions (Addendum)
 Medication Instructions:  Your physician recommends that you continue on your current medications as directed. Please refer to the Current Medication list given to you today.  *If you need a refill on your cardiac medications before your next appointment, please call your pharmacy*  Lab Work: None.  If you have labs (blood work) drawn today and your tests are completely normal, you will receive your results only by: MyChart Message (if you have MyChart) OR A paper copy in the mail If you have any lab test that is abnormal or we need to change your treatment, we will call you to review the results.  Testing/Procedures: None.  Follow-Up: At [facility], you and your health needs are our priority.  As part of our continuing mission to provide you with exceptional heart care, our providers are all part of one team.  This team includes your primary Cardiologist (physician) and Advanced Practice Providers or APPs (Physician Assistants and Nurse Practitioners) who all work together to provide you with the care you need, when you need it.  Your next appointment will be as needed and it will be with:    Provider:   Dr. Gordy Bergamo, MD    Other Instructions Please track your blood pressures at home. Your goal blood pressure is 130/80. If your blood pressure is higher than that, please contact your primary care provider.

## 2024-06-18 NOTE — Progress Notes (Signed)
 Cardiology Office Note:  .   Date:  06/18/2024  ID:  Philip Stark, DOB [birth year], MRN [id number] PCP: Leonel Cole, MD  [facility] Providers Cardiologist:  None   History of Present Illness: .   Philip Stark is a 58 y.o. 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 He has learning disability. 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    Referred for evaluation of MVP.    Discussed the use of AI scribe software for clinical note transcription with the patient, who gave verbal consent to proceed.  History of Present Illness Philip Stark is a 58 year old male with mitral valve prolapse who presents for cardiovascular evaluation. He is accompanied by his parents, Earnie and Ikechukwu Silvero. He was referred by Dr. Leonel for evaluation of his heart condition.  He has mitral valve prolapse diagnosed in 2010. A new gallop was recently heard on exam, prompting this evaluation.  He has intermittently elevated blood pressure, with a recent reading of 124/90 mmHg and higher readings when stressed, including during a recent emergency department visit. He is not on antihypertensive medication.  He lives alone, does not smoke, and walks his dog daily. 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 He weighs 153 pounds and is actively trying to improve his diet by reducing pasta and increasing vegetables.  Cardiac Studies relevent.    ECHOCARDIOGRAM COMPLETE 05/14/2024  1. Left ventricular ejection fraction, by estimation, is 55%. Left ventricular ejection fraction by 3D volume is 62 %. The left ventricle has normal function. The left ventricle  has no regional wall motion abnormalities. Left ventricular diastolic parameters are consistent with Grade I diastolic dysfunction (impaired relaxation). The average left ventricular global longitudinal strain is -12.1 %. The global longitudinal strain is abnormal but did not appear to track well. 2. Peak RV-RA gradient 17 mmHg. Right ventricular systolic function is normal. The right ventricular size is normal. 3. Bileaflet mitral valve prolapse. The mitral valve is abnormal. Mild to moderate mid to late systolic mitral valve regurgitation. No evidence of mitral stenosis.        CT CARDIAC SCORING (SELF PAY ONLY) 04/08/2024  Total score of 0.  No significant extracardiac abnormality.   Labs   Recent Labs    08/02/23 1153 09/16/23 1341  NA 140 138  K 3.8 4.2  CL 108 106  CO2 21* 23  GLUCOSE 109* 103*  BUN 18 17  CREATININE 1.09 1.10  CALCIUM 9.5 9.4  GFRNONAA >60 >60    Lab Results  Component Value Date   ALT 24 11/20/2020   AST 22 11/20/2020   ALKPHOS 83 11/20/2020   BILITOT 0.8 11/20/2020      Latest Ref Rng & Units 09/16/2023    1:41 PM 07/30/2023   10:03 AM 11/20/2020    2:35 PM  CBC  WBC 4.0 - 10.5 K/uL 9.4  9.0  10.6   Hemoglobin 13.0 - 17.0 g/dL 82.0  82.1  83.5   Hematocrit 39.0 - 52.0 % 52.6  52.5  48.1   Platelets 150 - 400 K/uL 210  225  253     Care everywhere/Faxed External Labs:  Labs 05/04/2024:  Hb 15.2/HCT 43.8, platelets 255.  Total cholesterol 198, triglycerides 144, HDL 44, LDL: 128.  Non-HDL cholesterol 154.  Serum glucose 118 mg, BUN 17, creatinine 1.29, eGFR 64 mL, potassium 4.8.  A1c 6.1%.  ROS  Review of Systems  Cardiovascular:  Negative for chest pain, dyspnea on exertion and leg swelling.   Physical Exam:   VS:  BP (!) 130/90 (BP Location: Left Arm, Patient Position: Sitting, Cuff Size: Normal)   Pulse 70   Resp 16   Ht 5' 8 (1.727 m)   Wt 165 lb 14.4 oz (75.3 kg)   SpO2 98%   BMI 25.23 kg/m    Wt Readings from Last 3  Encounters:  06/18/24 165 lb 14.4 oz (75.3 kg)  09/18/23 163 lb (73.9 kg)  09/16/23 163 lb (73.9 kg)    BP Readings from Last 3 Encounters:  06/18/24 (!) 130/90  05/11/24 (!) 140/78  04/28/24 132/82   Physical Exam Neck:     Vascular: No JVD.  Cardiovascular:     Rate and Rhythm: Normal rate and regular rhythm.     Pulses: Intact distal pulses.     Heart sounds: S1 normal and S2 normal. A midsystolic click. Murmur heard.     Mid to late systolic murmur is present with a grade of 1/6 at the apex.     No gallop.  Pulmonary:     Effort: Pulmonary effort is normal.     Breath sounds: Normal breath sounds.  Abdominal:     General: Bowel sounds are normal.     Palpations: Abdomen is soft.  Musculoskeletal:     Right lower leg: No edema.     Left lower leg: No edema.    EKG:    EKG Interpretation Date/Time:  Thursday June 18 2024 09:56:20 EST Ventricular Rate:  73 PR Interval:  140 QRS Duration:  80 QT Interval:  384 QTC Calculation: 423 R Axis:   -18  Text Interpretation: Normal sinus rhythm Minimal voltage criteria for LVH, may be normal variant ( R in aVL ) Possible Anterior infarct , age undetermined When compared with ECG of 16-Sep-2023 13:46, Premature ventricular complexes are no longer Present Confirmed by Jace Dowe, Jagadeesh [phone]) on 06/18/2024 10:18:22 AM    ASSESSMENT AND PLAN: .      ICD-10-CM   1. Mitral valve prolapse  I34.1 EKG 12-Lead    2. Elevated BP reading w/ no diagnosis of HTN  R03.0      Assessment & Plan Mitral valve prolapse Mild mitral valve prolapse with a midsystolic click and late systolic murmur. The valve is leaking mildly to at most moderately but appears healthy.  There is no myxomatous degeneration.  No indication for mitral valve repair or surgery. No need for antibiotics before dental procedures. No current symptoms warranting further intervention. - Monitor for loud murmurs or shortness of breath during activities. - Return for  evaluation if symptoms develop. - Images reviewed with the patient and his parents.  Hypertension Blood pressure readings indicate mild hypertension with a recent reading of 124/90 mmHg and a diastolic reading of 95 mmHg in the emergency room. Lifestyle modifications are recommended to manage blood pressure. - Monitor blood pressure regularly, aiming for a goal of 130/80 mmHg or less. - Follow up with primary care physician if blood pressure consistently exceeds 130/80 mmHg. - Reduce salt intake and lose 5-7 pounds. - Weight loss of 5 to 8 pounds would help with blood pressure control as well.  Learning disability Patient is accompanied by his parents,  I took time to explain to both his parents and to the patient who comprehends but needs repeated reiteration and discussed and reassured him.  Patient was extremely worried about his heart valve.  Primary prevention discussed.   Follow up: PRN  Signed,  Gordy Bergamo, MD, [facility] 06/18/2024, 10:35 AM [facility] [address] [place], [place] [address] Phone: [phone]. Fax:  [phone]
